# Patient Record
Sex: Male | Born: 1980 | Race: White | Hispanic: No | Marital: Married | Smoking: Never smoker
Health system: Southern US, Community
[De-identification: ages and names within clinical notes are randomized; demographics above are authoritative.]

## PROBLEM LIST (undated history)

## (undated) DIAGNOSIS — N200 Calculus of kidney: Secondary | ICD-10-CM

## (undated) HISTORY — PX: CHOLECYSTECTOMY: SHX55

---

## 2001-07-14 ENCOUNTER — Encounter: Payer: Self-pay | Admitting: Emergency Medicine

## 2001-07-14 ENCOUNTER — Emergency Department (HOSPITAL_COMMUNITY): Admission: EM | Admit: 2001-07-14 | Discharge: 2001-07-14 | Payer: Self-pay | Admitting: Emergency Medicine

## 2001-08-30 ENCOUNTER — Encounter: Admission: RE | Admit: 2001-08-30 | Discharge: 2001-08-30 | Payer: Self-pay | Admitting: Family Medicine

## 2001-08-30 ENCOUNTER — Encounter: Payer: Self-pay | Admitting: Family Medicine

## 2003-02-06 ENCOUNTER — Inpatient Hospital Stay (HOSPITAL_COMMUNITY): Admission: EM | Admit: 2003-02-06 | Discharge: 2003-02-08 | Payer: Self-pay

## 2005-01-31 ENCOUNTER — Emergency Department (HOSPITAL_COMMUNITY): Admission: EM | Admit: 2005-01-31 | Discharge: 2005-02-01 | Payer: Self-pay | Admitting: Emergency Medicine

## 2005-02-26 ENCOUNTER — Emergency Department (HOSPITAL_COMMUNITY): Admission: EM | Admit: 2005-02-26 | Discharge: 2005-02-26 | Payer: Self-pay | Admitting: Emergency Medicine

## 2007-08-19 ENCOUNTER — Emergency Department (HOSPITAL_COMMUNITY): Admission: EM | Admit: 2007-08-19 | Discharge: 2007-08-19 | Payer: Self-pay | Admitting: Emergency Medicine

## 2007-08-31 ENCOUNTER — Emergency Department (HOSPITAL_COMMUNITY): Admission: EM | Admit: 2007-08-31 | Discharge: 2007-09-01 | Payer: Self-pay | Admitting: Emergency Medicine

## 2007-10-02 ENCOUNTER — Encounter (INDEPENDENT_AMBULATORY_CARE_PROVIDER_SITE_OTHER): Payer: Self-pay | Admitting: Surgery

## 2007-10-02 ENCOUNTER — Ambulatory Visit (HOSPITAL_COMMUNITY): Admission: RE | Admit: 2007-10-02 | Discharge: 2007-10-02 | Payer: Self-pay | Admitting: Surgery

## 2009-01-26 ENCOUNTER — Emergency Department (HOSPITAL_BASED_OUTPATIENT_CLINIC_OR_DEPARTMENT_OTHER): Admission: EM | Admit: 2009-01-26 | Discharge: 2009-01-26 | Payer: Self-pay | Admitting: Internal Medicine

## 2011-03-19 ENCOUNTER — Emergency Department (INDEPENDENT_AMBULATORY_CARE_PROVIDER_SITE_OTHER): Payer: BC Managed Care – PPO

## 2011-03-19 ENCOUNTER — Emergency Department (HOSPITAL_BASED_OUTPATIENT_CLINIC_OR_DEPARTMENT_OTHER)
Admission: EM | Admit: 2011-03-19 | Discharge: 2011-03-19 | Disposition: A | Discharge status: Home / Self Care | Payer: BC Managed Care – PPO | Attending: Emergency Medicine | Admitting: Emergency Medicine

## 2011-03-19 DIAGNOSIS — N200 Calculus of kidney: Secondary | ICD-10-CM | POA: Insufficient documentation

## 2011-03-19 DIAGNOSIS — N1330 Unspecified hydronephrosis: Secondary | ICD-10-CM | POA: Insufficient documentation

## 2011-03-19 DIAGNOSIS — N201 Calculus of ureter: Secondary | ICD-10-CM

## 2011-03-19 DIAGNOSIS — N4289 Other specified disorders of prostate: Secondary | ICD-10-CM

## 2011-03-19 DIAGNOSIS — R109 Unspecified abdominal pain: Secondary | ICD-10-CM | POA: Insufficient documentation

## 2011-03-19 LAB — URINALYSIS, ROUTINE W REFLEX MICROSCOPIC: Ketones, ur: NEGATIVE mg/dL

## 2011-03-19 LAB — URINE MICROSCOPIC-ADD ON

## 2011-03-19 LAB — DIFFERENTIAL
Eosinophils Relative: 2 % (ref 0–5)
Lymphocytes Relative: 12 % (ref 12–46)
Lymphs Abs: 1 10*3/uL (ref 0.7–4.0)

## 2011-03-19 LAB — CBC
HCT: 49.7 % (ref 39.0–52.0)
Hemoglobin: 18.1 g/dL — ABNORMAL HIGH (ref 13.0–17.0)
MCH: 30 pg (ref 26.0–34.0)
MCV: 82.3 fL (ref 78.0–100.0)
RBC: 6.04 MIL/uL — ABNORMAL HIGH (ref 4.22–5.81)
WBC: 9.6 10*3/uL (ref 4.0–10.5)

## 2011-03-19 LAB — BASIC METABOLIC PANEL
CO2: 27 mEq/L (ref 19–32)
Calcium: 9.7 mg/dL (ref 8.4–10.5)
Chloride: 103 mEq/L (ref 96–112)
GFR calc Af Amer: >60 mL/min (ref >60)
GFR calc non Af Amer: >60 mL/min (ref >60)

## 2011-03-20 LAB — URINE CULTURE
Colony Count: 9000
Culture  Setup Time: 201204131745

## 2011-03-23 LAB — DIFFERENTIAL
Basophils Absolute: 0.3 10*3/uL — ABNORMAL HIGH (ref 0.0–0.1)
Eosinophils Absolute: 0 10*3/uL (ref 0.0–0.7)
Eosinophils Relative: 0 % (ref 0–5)
Lymphs Abs: 0.6 10*3/uL — ABNORMAL LOW (ref 0.7–4.0)
Monocytes Absolute: 1.4 10*3/uL — ABNORMAL HIGH (ref 0.1–1.0)
Neutrophils Relative %: 86 % — ABNORMAL HIGH (ref 43–77)

## 2011-03-23 LAB — COMPREHENSIVE METABOLIC PANEL
AST: 25 U/L (ref 0–37)
Alkaline Phosphatase: 76 U/L (ref 39–117)
CO2: 28 mEq/L (ref 19–32)
Calcium: 9.6 mg/dL (ref 8.4–10.5)
Chloride: 103 mEq/L (ref 96–112)
Creatinine, Ser: 1.1 mg/dL (ref 0.4–1.5)
GFR calc non Af Amer: >60 mL/min (ref >60)
Potassium: 3.8 mEq/L (ref 3.5–5.1)
Sodium: 144 mEq/L (ref 135–145)

## 2011-03-23 LAB — CBC
HCT: 52.6 % — ABNORMAL HIGH (ref 39.0–52.0)
MCV: 89.4 fL (ref 78.0–100.0)
Platelets: 221 10*3/uL (ref 150–400)
RBC: 5.89 MIL/uL — ABNORMAL HIGH (ref 4.22–5.81)
WBC: 15.9 10*3/uL — ABNORMAL HIGH (ref 4.0–10.5)

## 2011-03-23 LAB — URINALYSIS, ROUTINE W REFLEX MICROSCOPIC
Ketones, ur: NEGATIVE mg/dL
Specific Gravity, Urine: 1.03 (ref 1.005–1.030)

## 2011-04-08 ENCOUNTER — Emergency Department (HOSPITAL_COMMUNITY): Payer: BC Managed Care – PPO

## 2011-04-08 ENCOUNTER — Emergency Department (HOSPITAL_COMMUNITY)
Admission: EM | Admit: 2011-04-08 | Discharge: 2011-04-08 | Disposition: A | Discharge status: Home / Self Care | Payer: BC Managed Care – PPO | Attending: Emergency Medicine | Admitting: Emergency Medicine

## 2011-04-08 DIAGNOSIS — N201 Calculus of ureter: Secondary | ICD-10-CM | POA: Insufficient documentation

## 2011-04-08 DIAGNOSIS — Z9089 Acquired absence of other organs: Secondary | ICD-10-CM | POA: Insufficient documentation

## 2011-04-08 DIAGNOSIS — R109 Unspecified abdominal pain: Secondary | ICD-10-CM | POA: Insufficient documentation

## 2011-04-08 DIAGNOSIS — R918 Other nonspecific abnormal finding of lung field: Secondary | ICD-10-CM | POA: Insufficient documentation

## 2011-04-08 DIAGNOSIS — N390 Urinary tract infection, site not specified: Secondary | ICD-10-CM | POA: Insufficient documentation

## 2011-04-08 LAB — URINALYSIS, ROUTINE W REFLEX MICROSCOPIC
Glucose, UA: NEGATIVE mg/dL
Nitrite: NEGATIVE
Protein, ur: 30 mg/dL — AB
Specific Gravity, Urine: 1.031 — ABNORMAL HIGH (ref 1.005–1.030)
pH: 5.5 (ref 5.0–8.0)

## 2011-04-08 LAB — DIFFERENTIAL
Basophils Relative: 1 % (ref 0–1)
Eosinophils Relative: 2 % (ref 0–5)
Lymphocytes Relative: 14 % (ref 12–46)
Monocytes Relative: 8 % (ref 3–12)
Neutrophils Relative %: 76 % (ref 43–77)

## 2011-04-08 LAB — CBC
MCH: 30.9 pg (ref 26.0–34.0)
MCHC: 36.4 g/dL — ABNORMAL HIGH (ref 30.0–36.0)
MCV: 84.9 fL (ref 78.0–100.0)

## 2011-04-08 LAB — URINE MICROSCOPIC-ADD ON

## 2011-04-09 ENCOUNTER — Ambulatory Visit (HOSPITAL_COMMUNITY)
Admission: RE | Admit: 2011-04-09 | Discharge: 2011-04-09 | Disposition: A | Discharge status: Home / Self Care | Payer: BC Managed Care – PPO | Source: Ambulatory Visit | Attending: Urology | Admitting: Urology

## 2011-04-09 DIAGNOSIS — N201 Calculus of ureter: Secondary | ICD-10-CM | POA: Insufficient documentation

## 2011-04-09 DIAGNOSIS — Z79899 Other long term (current) drug therapy: Secondary | ICD-10-CM | POA: Insufficient documentation

## 2011-04-09 DIAGNOSIS — Z9089 Acquired absence of other organs: Secondary | ICD-10-CM | POA: Insufficient documentation

## 2011-04-09 LAB — BASIC METABOLIC PANEL
BUN: 13 mg/dL (ref 6–23)
CO2: 27 mEq/L (ref 19–32)
Chloride: 103 mEq/L (ref 96–112)
Glucose, Bld: 91 mg/dL (ref 70–99)
Potassium: 3.4 mEq/L — ABNORMAL LOW (ref 3.5–5.1)
Sodium: 139 mEq/L (ref 135–145)

## 2011-04-09 LAB — POCT I-STAT, CHEM 8
BUN: 14 mg/dL (ref 6–23)
Calcium, Ion: 1.19 mmol/L (ref 1.12–1.32)
Creatinine, Ser: 1.4 mg/dL (ref 0.4–1.5)
Hemoglobin: 17.3 g/dL — ABNORMAL HIGH (ref 13.0–17.0)
TCO2: 27 mmol/L (ref 0–100)

## 2011-04-09 LAB — URINE CULTURE: Colony Count: NO GROWTH

## 2011-04-09 LAB — SURGICAL PCR SCREEN: MRSA, PCR: NEGATIVE

## 2011-04-10 NOTE — Op Note (Signed)
Jimmy Serrano, Jimmy Serrano              ACCOUNT NO.:  [id number]  MEDICAL RECORD NO.:  [id number]           PATIENT TYPE:  O  LOCATION:  DAYL                         FACILITY:  [facility]  PHYSICIAN:  Heloise Purpura, MD      DATE OF BIRTH:  July 03, 1981  DATE OF PROCEDURE:  04/09/2011 DATE OF DISCHARGE:                              OPERATIVE REPORT   PREOPERATIVE DIAGNOSIS:  Right ureteral calculus.  POSTOPERATIVE DIAGNOSIS:  Right ureteral calculus.  PROCEDURES: 1. Cystoscopy. 2. Right retrograde pyelography with interpretation. 3. Right ureteroscopic laser lithotripsy and stone removal. 4. Right ureteral stent placement (6 x 24).  SURGEON:  Heloise Purpura, MD  ANESTHESIA:  General.  COMPLICATIONS:  None.  INDICATIONS:  Jimmy Serrano is a 30 year old gentleman who had recently presented with a 4-mm right mid ureteral calculus to Dr. Vonita Moss.  He had episodes of fever and apparently had been placed on antibiotics a few days after his initial diagnosis.  He then presented today to me for the first time with complaints of worsening flank pain and general malaise, although no objective fever.  He is currently on ciprofloxacin. After discussing options, I recommended that he proceed to the operating room for ureteroscopic intervention and if he was found to be infected intraoperatively, I recommended right ureteral stent placement with definitive therapy reserved following appropriate antibiotic therapy. His urinalysis was negative for infection on evaluation earlier today. The potential risks, complications, and alternative treatment options were discussed in detail and informed consent obtained.  DESCRIPTION OF PROCEDURE:  The patient was taken to the operating room and a general anesthetic was administered.  He was given preoperative antibiotics, placed in the dorsal lithotomy position, and prepped and draped in the usual sterile fashion.  Next, a preoperative time-out was performed.   Cystourethroscopy was then performed, which revealed a normal urethra and no evidence of any bladder tumors, stones, or other mucosal abnormalities within the bladder on systematic examination.  The right ureteral orifice was then identified and intubated with a 6-French ureteral catheter.  Omnipaque contrast was injected and there was noted to be a filling defect within the distal right ureter consistent with the patient's known stone.  A 0.038 sensor guidewire was then advanced up into the right renal pelvis under fluoroscopic guidance and a 6- French semi-rigid ureteroscope was advanced next to the wire.  There was noted to be a narrowed area within the distal ureter, although this was able to be traversed with the ureteroscope.  The stone was identified immediately beyond this narrowed area and a 365 micron holmium laser fiber was then used to fragment the stone on a setting of 0.6 joules and 6 Hz.  Once adequate fragmentation was performed, all stone fragments were removed with a ZeroTip nitinol basket.  Repeat ureteroscopy revealed no remaining stone fragments.  No purulent material or other evidence for acute infection was identified.  Although ureteroscopy was atraumatic, based on the patient's history of fever approximately a week ago, it was felt that an ureteral stent was indicated.  Therefore, the wire was back loaded on the cystoscope and a 6 x 24 double-J ureteral stent was  advanced over the wire using Seldinger technique.  It was appropriately positioned under fluoroscopic and cystoscopic guidance. The wire was then removed with a good curl noted in the renal pelvis as well as in the bladder.  The patient's bladder was emptied.  He tolerated the procedure well without complications.  He was able to be awakened and transferred to recovery unit in satisfactory condition.     Heloise Purpura, MD     LB/MEDQ  D:  04/09/2011  T:  04/10/2011  Job:  [id number]  Electronically  Signed by Heloise Purpura MD on 04/10/2011 04:22:31 PM

## 2011-04-20 NOTE — Op Note (Signed)
NAMEMATVEY, LLANAS              ACCOUNT NO.:  [id number]   MEDICAL RECORD NO.:  [id number]          PATIENT TYPE:  AMB   LOCATION:  DAY                          FACILITY:  [facility]   PHYSICIAN:  Currie Paris, M.D.DATE OF BIRTH:  [birth year]   DATE OF PROCEDURE:  10/02/2007  DATE OF DISCHARGE:                               OPERATIVE REPORT   [phone].   PREOPERATIVE DIAGNOSIS:  Gallstones and chronic cholecystitis.   POSTOPERATIVE DIAGNOSIS:  Gallstones and chronic cholecystitis.   OPERATION:  Laparoscopic cholecystectomy with operative cholangiogram.   SURGEON:  Currie Paris, M.D.   ASSISTANT:  Lennie Muckle, MD   ANESTHESIA:  General endotracheal.   CLINICAL HISTORY:  This is a 30 year old male recently presented to  emergency room with acute episode of biliary colic and was found to have  gallstones.  He was scheduled for elective cholecystectomy.   DESCRIPTION OF PROCEDURE:  The patient was seen in the holding area and  he had no further questions.  We confirmed that cholecystectomy was the  planned procedure.   The patient was taken to the operating room and after satisfactory  general endotracheal anesthesia had been obtained the abdomen was  clipped, prepped and draped.  The time-out was performed.   I used 0.25% plain Marcaine for each incision.  The umbilical incision  was made, the fascia identified and the peritoneal cavity entered under  direct vision.  A pursestring was placed, Hasson introduced and the  abdomen insufflated to 15.  The camera was placed and we saw that the  gallbladder was a little bit tense but not acutely inflamed.  No other  abnormalities were noted.   Under direct vision a 10-11 was placed in the epigastrium and two 5 mm  laterally.  The gallbladder was retracted over the liver.  Dissection  began the triangle of Calot, identified a nice long segment of cystic  duct as well as the cystic artery.  I clipped the cystic artery  one-time  and the cystic duct once at its junction with the gallbladder.  Prior to  doing so I opened up the peritoneum in the triangle of Calot and had a  nice window to make sure I had the anatomy correctly identified.   The [facility] catheter was used introducing into the cystic duct and held  with two clips and cholangiography done which showed a fairly long  cystic duct filling of the common duct and duodenum and filling of  hepatic radicals.   The catheter was removed and three clips placed on the stay side of  cystic duct and it was divided.  The peritoneum of the gallbladder was  freed up a little bit more and additional clips placed on the anterior  and posterior branches of cystic artery and they were divided and the  gallbladder removed from below to above.  It was placed in a bag and we  spent several minutes irrigating making sure the bed of gallbladder was  completely dry and saw no evidence of either bleeding or bile leaks.   The gallbladder was brought out the umbilical  port.  The abdomen was  reinsufflated and a final check again made to be sure there is no  bleeding or bile leak and all appeared okay.   The irrigant fluid was suctioned out.  The lateral drains removed under  direct vision.  The pursestring was used to close the umbilical port.  The abdomen was deflated through the epigastric port.  Skin was closed  with 4-0 Monocryl subcuticular plus Dermabond.   The patient tolerated the procedure well and there no operative  complications.  All counts were correct.      Currie Paris, M.D.  Electronically Signed     CJS/MEDQ  D:  10/02/2007  T:  10/03/2007  Job:  [id number]

## 2011-04-23 NOTE — H&P (Signed)
NAME:  Jimmy Serrano, Jimmy Serrano                        ACCOUNT NO.:  [id number]   MEDICAL RECORD NO.:  [id number]                   PATIENT TYPE:  INP   LOCATION:  1827                                 FACILITY:  [facility]   PHYSICIAN:  Juan-Carlos Monguilod, M.D.         DATE OF BIRTH:  [birth year]   DATE OF ADMISSION:  02/06/2003  DATE OF DISCHARGE:                                HISTORY & PHYSICAL   CHIEF COMPLAINT:  Intractable nausea, vomiting, diarrhea.   PROBLEM LIST:  1. Intractable nausea and vomiting, diarrhea x 24 hours with epigastric     pain, fever with temperature of 101, white blood cells elevated at 16.0,     mild elevation of total bilirubin with normal amylase and lipase.  2. Dehydration with acute renal failure with BUN currently 17, creatinine     1.3, unclear normal baseline.  3. History of right kidney stone, 1 to 2 mm.  CT scan of the abdomen and     pelvis done July 17, 2001 notes 1 to 2 mm distal right ureteral     calculus.  There was mild distention of the right renal collecting     system.  4. Left knee pain 08/31/2001 status post MRI of the left knee, notes bone     bruise medial femoral condyle, sprain/partial tear proximal to mid aspect     lateral collateral ligament, attenuated anterior cruciate ligament     suggestive of partial tear of questionable age, mild irregularity free     edge of bone both medial and lateral meniscus without evidence of     displaced meniscal fragment and small joint effusion present.   ALLERGIES:  No known drug allergies.   HISTORY OF PRESENT ILLNESS:  The patient began feeling nauseous in the  afternoon of 02/05/2003.  This progressed, and overnight he had multiple  episodes of vomiting and diarrhea.  Unable to keep food or fluids down.  There was some epigastric pain described as mild/moderate.  This did not  improve by a.m., and the patient came to [facility] for evaluation  and treatment.   FAMILY HISTORY:  Mother  and father living.  No reports of familial diseases,  and review was noncontributory.   SOCIAL HISTORY:  The patient is married and is followed in primary care by  Dr. Arlyce Dice by  [facility].  Has had no significant medical  history other than above.  Takes no routine medications.  He is a nonsmoker  and nondrinker.  Does not use illicit drugs.   REVIEW OF SYSTEMS:  HEENT:  No visual changes, no headache, no nose bleeds  or sinus drainage.  No swallowing difficulties.  HEART:  No chest pain or  palpitations.  LUNGS: No shortness of breath or cough.  ABDOMEN: As under  History of Present Illness.  The patient also has a distant history of right  abdominal pain with ER visit noted per CT  scan for right renal calculus.  URINARY/GENITAL:  Denies any dysuria, hematuria, or any symptoms suggestive  of urinary tract infection.  MUSCULOSKELETAL: No traumas or joint pain.  NEUROLOGIC:  No focal changes.  INTEGUMENTARY:  No abnormal ulcers or  lesions.   MEDICATIONS ON ADMISSION:  No routine medications or prescription  medications used.   PHYSICAL EXAMINATION:  VITAL SIGNS:  Temperature 101.9, pulse 114,  respirations 20, blood pressure 112/68.  GENERAL:  Well-developed young male in no acute distress.  Alert, oriented,  cooperative.  HEENT:  Normocephalic.  Eyes: PERRLA.  Ears: Canals clear.  Nose: Nares  clear without masses or discharge noted.  Oral mucosa pink though somewhat  dry.  NECK:  No jugular venous distention or bruits.  HEART:  Rate and rhythm regular without murmur, S3, S4.  LUNGS:  Breath sounds clear and equal bilaterally.  ABDOMEN:  Soft and round with positive though very hypotonic bowel sounds in  all four quadrants.  Mild diffuse pain seems greatest over the epigastric  area.  No rebound or guarding.  URINARY/GENITAL:  No bladder pain or CVA tenderness.  RECTAL:  Deferred.  MUSCULOSKELETAL:  Range of motion and strength 5/5 and equal.  NEUROLOGIC:   Cranial nerves II-XII grossly intact without unilateral or  focal defects.  INTEGUMENTARY:  Skin warm and dry.   LABORATORY DATA:  CT scan of abdomen and pelvis noted mild sigmoid tic,  otherwise negative.   CBC found WBC 16.0, hemoglobin 18.0, hematocrit 51.1, platelets 195,  neutrophil elevated 92, lymphocyte low at 2.  Metabolic panel: Sodium 131,  potassium 3.7, chloride 105, CO2 27, BUN 17, and creatinine 1.3.  Glucose  elevated at 168.  Liver function tests unremarkable, though total bilirubin  mildly elevated at 1.5.  Lipase 23 and amylase 59.  Urinalysis noted  elevated specific gravity greater than 1.040, otherwise unremarkable.   IMPRESSION AND PLAN:  1. Intractable nausea and vomiting likely secondary to viral     gastroenteritis.  Though viral gastroenteritis is the most likely     etiology here, will have to keep in mind possibilities of irritable bowel     syndrome or ulcerative colitis.  Supportive care at this point with IV     fluids, clear liquids as tolerated, and advance after 24 hours back to     previous diet.  No indication for antibiotic use.  Urine appears benign     other than indicating dehydration.  Would recheck a CBC in a.m.  2. Dehydration with acute renal failure.  Will need intravenous hydration     with normal saline at 200 ml per hour.  This until oral diet and fluid     intake reestablished.  Would recheck a basic metabolic panel in the a.m.  3. Intractable nausea and vomiting secondary to problem #1.  Provide     antiemetic IV as needed.  Clear liquid diet and advance in 24 hours if     tolerated back to previous.  4. Intractable diarrhea secondary to #1.  Imodium p.r.n.  If this persists,     would evaluate stool ovum and parasite, culture.  5. Mild hyperglycemia thought likely secondary to infection.  Will check a     hemoglobin A1C in a.m.  6. Allergies:  No known drug allergies.    Everett Graff, N.P.                     Rosanne Sack, M.D.    TC/MEDQ  D:  02/06/2003  T:  02/06/2003  Job:  [id number]

## 2011-09-15 LAB — COMPREHENSIVE METABOLIC PANEL WITH GFR
ALT: 42
AST: 29
Albumin: 4.6
Alkaline Phosphatase: 62
BUN: 12
CO2: 28
Calcium: 9.4
Chloride: 104
Creatinine, Ser: 1.05
GFR calc non Af Amer: >60
Glucose, Bld: 93
Potassium: 3.8
Sodium: 141
Total Bilirubin: 1.6 — ABNORMAL HIGH
Total Protein: 7.4

## 2011-09-15 LAB — URINALYSIS, ROUTINE W REFLEX MICROSCOPIC
Hgb urine dipstick: NEGATIVE
Nitrite: NEGATIVE
Protein, ur: NEGATIVE
Specific Gravity, Urine: 1.024
Urobilinogen, UA: 1

## 2011-09-15 LAB — CBC
MCV: 88
Platelets: 203
RBC: 5.56
WBC: 7.3

## 2011-09-15 LAB — DIFFERENTIAL
Basophils Absolute: 0.1
Eosinophils Absolute: 0.2
Eosinophils Relative: 3
Lymphocytes Relative: 19
Monocytes Absolute: 0.7

## 2011-09-15 LAB — LIPASE, BLOOD: Lipase: 27

## 2011-09-16 LAB — CBC
HCT: 47
Hemoglobin: 16.4
MCHC: 35
Platelets: 211
RDW: 12.7

## 2011-09-16 LAB — I-STAT 8, (EC8 V) (CONVERTED LAB)
Acid-Base Excess: 1
Chloride: 105
HCT: 49
Hemoglobin: 16.7
Operator id: 270651
Potassium: 3.6
Sodium: 139
TCO2: 27

## 2011-09-16 LAB — POCT I-STAT CREATININE
Creatinine, Ser: 1.2
Operator id: 270651

## 2011-09-16 LAB — COMPREHENSIVE METABOLIC PANEL
Albumin: 4.5
BUN: 14
Calcium: 9.2
Glucose, Bld: 128 — ABNORMAL HIGH
Total Protein: 7.5

## 2011-09-16 LAB — DIFFERENTIAL
Basophils Absolute: 0
Basophils Relative: 0
Eosinophils Relative: 0
Monocytes Absolute: 1 — ABNORMAL HIGH
Neutro Abs: 10.7 — ABNORMAL HIGH

## 2011-09-16 LAB — LIPASE, BLOOD: Lipase: 27

## 2011-09-17 LAB — SAMPLE TO BLOOD BANK

## 2011-09-17 LAB — I-STAT 8, (EC8 V) (CONVERTED LAB)
Acid-Base Excess: 1
BUN: 14
Chloride: 104
pCO2, Ven: 45.5
pH, Ven: 7.385 — ABNORMAL HIGH

## 2011-09-17 LAB — DIFFERENTIAL
Basophils Absolute: 0.1
Eosinophils Relative: 4
Lymphocytes Relative: 21
Neutrophils Relative %: 64

## 2011-09-17 LAB — POCT I-STAT CREATININE: Creatinine, Ser: 1.2

## 2011-09-17 LAB — CBC
HCT: 48.3
Platelets: 246
RDW: 13.1

## 2017-01-18 ENCOUNTER — Encounter (HOSPITAL_BASED_OUTPATIENT_CLINIC_OR_DEPARTMENT_OTHER): Payer: Self-pay | Admitting: *Deleted

## 2017-01-18 ENCOUNTER — Emergency Department (HOSPITAL_BASED_OUTPATIENT_CLINIC_OR_DEPARTMENT_OTHER)
Admission: EM | Admit: 2017-01-18 | Discharge: 2017-01-18 | Disposition: A | Discharge status: Home / Self Care | Payer: BLUE CROSS/BLUE SHIELD | Attending: Emergency Medicine | Admitting: Emergency Medicine

## 2017-01-18 ENCOUNTER — Emergency Department (HOSPITAL_BASED_OUTPATIENT_CLINIC_OR_DEPARTMENT_OTHER): Payer: BLUE CROSS/BLUE SHIELD

## 2017-01-18 DIAGNOSIS — R5383 Other fatigue: Secondary | ICD-10-CM | POA: Insufficient documentation

## 2017-01-18 DIAGNOSIS — F17200 Nicotine dependence, unspecified, uncomplicated: Secondary | ICD-10-CM | POA: Insufficient documentation

## 2017-01-18 DIAGNOSIS — R0789 Other chest pain: Secondary | ICD-10-CM | POA: Insufficient documentation

## 2017-01-18 DIAGNOSIS — R630 Anorexia: Secondary | ICD-10-CM | POA: Diagnosis not present

## 2017-01-18 DIAGNOSIS — R0602 Shortness of breath: Secondary | ICD-10-CM | POA: Diagnosis not present

## 2017-01-18 DIAGNOSIS — R531 Weakness: Secondary | ICD-10-CM | POA: Insufficient documentation

## 2017-01-18 HISTORY — DX: Calculus of kidney: N20.0

## 2017-01-18 LAB — BASIC METABOLIC PANEL
ANION GAP: 4 — AB (ref 5–15)
BUN: 15 mg/dL (ref 6–20)
CALCIUM: 8.9 mg/dL (ref 8.9–10.3)
CO2: 26 mmol/L (ref 22–32)
CREATININE: 1.16 mg/dL (ref 0.61–1.24)
Chloride: 106 mmol/L (ref 101–111)
GFR calc Af Amer: >60 mL/min (ref >60)
Glucose, Bld: 96 mg/dL (ref 65–99)
Potassium: 3.7 mmol/L (ref 3.5–5.1)
Sodium: 136 mmol/L (ref 135–145)

## 2017-01-18 LAB — TROPONIN I: Troponin I: <0.03 ng/mL (ref <0.03)

## 2017-01-18 LAB — CBC WITH DIFFERENTIAL/PLATELET
Basophils Absolute: 0.1 10*3/uL (ref 0.0–0.1)
Basophils Relative: 1 %
EOS ABS: 0.4 10*3/uL (ref 0.0–0.7)
EOS PCT: 4 %
HCT: 44.8 % (ref 39.0–52.0)
Hemoglobin: 16.3 g/dL (ref 13.0–17.0)
LYMPHS ABS: 2.6 10*3/uL (ref 0.7–4.0)
Lymphocytes Relative: 24 %
MCH: 31 pg (ref 26.0–34.0)
MCHC: 36.4 g/dL — AB (ref 30.0–36.0)
MCV: 85.2 fL (ref 78.0–100.0)
MONOS PCT: 10 %
Monocytes Absolute: 1 10*3/uL (ref 0.1–1.0)
NEUTROS PCT: 63 %
Neutro Abs: 6.7 10*3/uL (ref 1.7–7.7)
PLATELETS: 212 10*3/uL (ref 150–400)
RBC: 5.26 MIL/uL (ref 4.22–5.81)
RDW: 12.7 % (ref 11.5–15.5)
WBC: 10.8 10*3/uL — AB (ref 4.0–10.5)

## 2017-01-18 LAB — BRAIN NATRIURETIC PEPTIDE: B NATRIURETIC PEPTIDE 5: 7.6 pg/mL (ref 0.0–100.0)

## 2017-01-18 MED ORDER — DOXYCYCLINE HYCLATE 100 MG PO CAPS: 100.0000 mg | ORAL_CAPSULE | Freq: Two times a day (BID) | ORAL | 0 refills | Status: AC

## 2017-01-18 MED ORDER — ASPIRIN 81 MG PO CHEW: 324.0000 mg | CHEWABLE_TABLET | Freq: Once | ORAL | Status: DC

## 2017-01-18 NOTE — ED Triage Notes (Signed)
Weak and fatigue over the past 3 days. Symptoms have progressed to SOB with ambulating. He was seen at [facility] tonight and told he does not have the flu.

## 2017-01-18 NOTE — ED Provider Notes (Signed)
Emergency Department Provider Note  By signing my name below, I, Majel Homereyton Lee, attest that this documentation has been prepared under the direction and in the presence of Maia PlanJoshua G Bettymae Yott, MD . Electronically Signed: Majel HomerPeyton Lee, Scribe. 01/18/2017. 6:52 PM.  I have reviewed the triage vital signs and the nursing notes.  HISTORY  Chief Complaint Shortness of Breath  HPI Comments: Jimmy Serrano is a 36 y.o. male who presents to the Emergency Department complaining of persistent, weakness and fatigue that began ~3 days ago. Pt reports associated left sided, constant, chest "pressure" that began 2 days ago, shortness of breath that is exacerbated with exertion, and decreased appetite. He notes he awoke several times last night with left arm pain described as "it falling asleep" and experienced similar pain in his left hand today. He also states a "sharp" pain that begins at the base of his skull and radiates into his posterior neck that began this morning. Pt reports he visited an Urgent Care facility this afternoon in which he was given four aspirin and referred to the ED for further evaluation. He states he has also taken Advil at home for his pain without any relief. He denies any obvious bleeding, melena, leg swelling, hx of HTN, HLD, DM, and FHx of MI.   Past Medical History:  Diagnosis Date  . Kidney stones    There are no active problems to display for this patient.  Past Surgical History:  Procedure Laterality Date  . CHOLECYSTECTOMY     Allergies Patient has no known allergies.  No family history on file.  Social History Social History  Substance Use Topics  . Smoking status: Never Smoker  . Smokeless tobacco: Current User  . Alcohol use No    Review of Systems   10-point ROS otherwise negative.  ____________________________________________   PHYSICAL EXAM:  VITAL SIGNS: ED Triage Vitals  Enc Vitals Group     BP 01/18/17 1811 141/95     Pulse Rate 01/18/17  1811 76     Resp 01/18/17 1811 20     Temp 01/18/17 1811 98.3 F (36.8 C)     Temp Source 01/18/17 1811 Oral     SpO2 01/18/17 1811 98 %     Weight 01/18/17 1809 214 lb (97.1 kg)     Height 01/18/17 1809 5\' 11"  (1.803 m)     Pain Score 01/18/17 1809 5   Constitutional: Alert and oriented. Well appearing and in no acute distress. Eyes: Conjunctivae are normal.  Head: Atraumatic. Nose: No congestion/rhinnorhea. Mouth/Throat: Mucous membranes are moist.  Oropharynx non-erythematous. Neck: No stridor.   Cardiovascular: Normal rate, regular rhythm. Good peripheral circulation. Grossly normal heart sounds.   Respiratory: Normal respiratory effort.  No retractions. Lungs CTAB. Gastrointestinal: Soft and nontender. No distention.  Musculoskeletal: No lower extremity tenderness nor edema. No gross deformities of extremities. Neurologic:  Normal speech and language. No gross focal neurologic deficits are appreciated.  Skin:  Skin is warm, dry and intact. No rash noted. Psychiatric: Mood and affect are normal. Speech and behavior are normal.  ____________________________________________   LABS (all labs ordered are listed, but only abnormal results are displayed)  Labs Reviewed  BASIC METABOLIC PANEL - Abnormal; Notable for the following:       Result Value   Anion gap 4 (*)    All other components within normal limits  CBC WITH DIFFERENTIAL/PLATELET - Abnormal; Notable for the following:    WBC 10.8 (*)    MCHC 36.4 (*)  All other components within normal limits  TROPONIN I  BRAIN NATRIURETIC PEPTIDE   ____________________________________________  EKG   EKG Interpretation  Date/Time:  Tuesday January 18 2017 18:17:23 EST Ventricular Rate:  78 PR Interval:  146 QRS Duration: 94 QT Interval:  362 QTC Calculation: 412 R Axis:   46 Text Interpretation:  Normal sinus rhythm Normal ECG No STEMI.  Confirmed by Khalil Belote MD, Kawthar Ennen [id number]) on 01/18/2017 6:46:00 PM        ____________________________________________  RADIOLOGY  Dg Chest 2 View  Result Date: 01/18/2017 CLINICAL DATA:  Chest pain, shob, weakness, left arm pain x 2 days. Only has history of kidney stones and cholecystectomy. EXAM: CHEST  2 VIEW COMPARISON:  02/26/2005 FINDINGS: Low lung volumes are present, causing crowding of the pulmonary vasculature. Bandlike retrocardiac density believed to be in the left lower lobe. No pleural effusion. The lungs appear otherwise clear. Cardiac and mediastinal margins appear normal. IMPRESSION: 1. Bandlike left lower lobe density favors atelectasis over early pneumonia. 2. Low lung volumes are present, causing crowding of the pulmonary vasculature. Electronically Signed   By: Gaylyn Rong M.D.   On: 01/18/2017 19:25    ____________________________________________   PROCEDURES  Procedure(s) performed:   Procedures  None ____________________________________________   INITIAL IMPRESSION / ASSESSMENT AND PLAN / ED COURSE  Pertinent labs & imaging results that were available during my care of the patient were reviewed by me and considered in my medical decision making (see chart for details).  Patient presents to the ED for evaluation of constant chest pressure for the last several days. He also notes SOB with ambulation. No evidence of volume overload on exam. CXR unremarkable. Normal EKG and troponin with low HEART score. With 3 days of constant symptoms will not trend biomarkers. Plan for PCP and Cardiology follow up in the coming days to schedule outpatient ECHO and Stress Testing. Wife and patient verbalized understanding and are pleased at discharge.   At this time, I do not feel there is any life-threatening condition present. I have reviewed and discussed all results (EKG, imaging, lab, urine as appropriate), exam findings with patient. I have reviewed nursing notes and appropriate previous records.  I feel the patient is safe to be  discharged home without further emergent workup. Discussed usual and customary return precautions. Patient and family (if present) verbalize understanding and are comfortable with this plan.  Patient will follow-up with their primary care provider. If they do not have a primary care provider, information for follow-up has been provided to them. All questions have been answered.   I personally performed the services described in this documentation, which was scribed in my presence. The recorded information has been reviewed and is accurate.    ____________________________________________  FINAL CLINICAL IMPRESSION(S) / ED DIAGNOSES  Final diagnoses:  SOB (shortness of breath)     MEDICATIONS GIVEN DURING THIS VISIT:  Medications - No data to display   NEW OUTPATIENT MEDICATIONS STARTED DURING THIS VISIT:  Discharge Medication List as of 01/18/2017  7:53 PM    START taking these medications   Details  doxycycline (VIBRAMYCIN) 100 MG capsule Take 1 capsule (100 mg total) by mouth 2 (two) times daily., Starting Tue 01/18/2017, Until Tue 01/25/2017, Print       Note:  This document was prepared using Dragon voice recognition software and may include unintentional dictation errors.  Alona Bene, MD Emergency Medicine   Maia Plan, MD 01/19/17 1425

## 2017-01-18 NOTE — Discharge Instructions (Signed)
We believe that your symptoms are caused today by pneumonia, an infection in your lung(s).  Fortunately you should start to improve quickly after taking your antibiotics.  Please take the full course of antibiotics as prescribed and drink plenty of fluids.   ° °Follow up with your doctor within 1-2 days.  If you develop any new or worsening symptoms, including but not limited to fever in spite of taking over-the-counter ibuprofen and/or Tylenol, persistent vomiting, worsening shortness of breath, or other symptoms that concern you, please return to the Emergency Department immediately.  ° ° °Pneumonia °Pneumonia is an infection of the lungs.  °CAUSES °Pneumonia may be caused by bacteria or a virus. Usually, these infections are caused by breathing infectious particles into the lungs (respiratory tract). °SIGNS AND SYMPTOMS  °Cough. °Fever. °Chest pain. °Increased rate of breathing. °Wheezing. °Mucus production. °DIAGNOSIS  °If you have the common symptoms of pneumonia, your health care provider will typically confirm the diagnosis with a chest X-ray. The X-ray will show an abnormality in the lung (pulmonary infiltrate) if you have pneumonia. Other tests of your blood, urine, or sputum may be done to find the specific cause of your pneumonia. Your health care provider may also do tests (blood gases or pulse oximetry) to see how well your lungs are working. °TREATMENT  °Some forms of pneumonia may be spread to other people when you cough or sneeze. You may be asked to wear a mask before and during your exam. Pneumonia that is caused by bacteria is treated with antibiotic medicine. Pneumonia that is caused by the influenza virus may be treated with an antiviral medicine. Most other viral infections must run their course. These infections will not respond to antibiotics.  °HOME CARE INSTRUCTIONS  °Cough suppressants may be used if you are losing too much rest. However, coughing protects you by clearing your lungs. You  should avoid using cough suppressants if you can. °Your health care provider may have prescribed medicine if he or she thinks your pneumonia is caused by bacteria or influenza. Finish your medicine even if you start to feel better. °Your health care provider may also prescribe an expectorant. This loosens the mucus to be coughed up. °Take medicines only as directed by your health care provider. °Do not smoke. Smoking is a common cause of bronchitis and can contribute to pneumonia. If you are a smoker and continue to smoke, your cough may last several weeks after your pneumonia has cleared. °A cold steam vaporizer or humidifier in your room or home may help loosen mucus. °Coughing is often worse at night. Sleeping in a semi-upright position in a recliner or using a couple pillows under your head will help with this. °Get rest as you feel it is needed. Your body will usually let you know when you need to rest. °PREVENTION °A pneumococcal shot (vaccine) is available to prevent a common bacterial cause of pneumonia. This is usually suggested for: °People over 65 years old. °Patients on chemotherapy. °People with chronic lung problems, such as bronchitis or emphysema. °People with immune system problems. °If you are over 65 or have a high risk condition, you may receive the pneumococcal vaccine if you have not received it before. In some countries, a routine influenza vaccine is also recommended. This vaccine can help prevent some cases of pneumonia. You may be offered the influenza vaccine as part of your care. °If you smoke, it is time to quit. You may receive instructions on how to stop smoking. Your   health care provider can provide medicines and counseling to help you quit. °SEEK MEDICAL CARE IF: °You have a fever. °SEEK IMMEDIATE MEDICAL CARE IF:  °Your illness becomes worse. This is especially true if you are elderly or weakened from any other disease. °You cannot control your cough with suppressants and are losing  sleep. °You begin coughing up blood. °You develop pain which is getting worse or is uncontrolled with medicines. °Any of the symptoms which initially brought you in for treatment are getting worse rather than better. °You develop shortness of breath or chest pain. °MAKE SURE YOU:  °Understand these instructions. °Will watch your condition. °Will get help right away if you are not doing well or get worse. °Document Released: 11/22/2005 Document Revised: 04/08/2014 Document Reviewed: 02/11/2011 °ExitCare® Patient Information ©2015 [employer]. This information is not intended to replace advice given to you by your health care provider. Make sure you discuss any questions you have with your health care provider. ° ° ° °

## 2018-02-15 ENCOUNTER — Other Ambulatory Visit: Payer: Self-pay

## 2018-02-15 ENCOUNTER — Encounter (HOSPITAL_COMMUNITY): Payer: Self-pay | Admitting: Emergency Medicine

## 2018-02-15 ENCOUNTER — Emergency Department (HOSPITAL_COMMUNITY): Payer: Commercial Managed Care - PPO

## 2018-02-15 ENCOUNTER — Emergency Department (HOSPITAL_COMMUNITY)
Admission: EM | Admit: 2018-02-15 | Discharge: 2018-02-15 | Disposition: A | Discharge status: Home / Self Care | Payer: Commercial Managed Care - PPO | Attending: Emergency Medicine | Admitting: Emergency Medicine

## 2018-02-15 DIAGNOSIS — R0789 Other chest pain: Secondary | ICD-10-CM

## 2018-02-15 LAB — DIFFERENTIAL
BASOS ABS: 0.1 10*3/uL (ref 0.0–0.1)
BASOS PCT: 1 %
Eosinophils Absolute: 0.2 10*3/uL (ref 0.0–0.7)
Eosinophils Relative: 2 %
Lymphocytes Relative: 23 %
Lymphs Abs: 1.8 10*3/uL (ref 0.7–4.0)
MONOS PCT: 10 %
Monocytes Absolute: 0.8 10*3/uL (ref 0.1–1.0)
NEUTROS ABS: 5.1 10*3/uL (ref 1.7–7.7)
NEUTROS PCT: 64 %

## 2018-02-15 LAB — HEPATIC FUNCTION PANEL
ALBUMIN: 4.4 g/dL (ref 3.5–5.0)
ALT: 31 U/L (ref 17–63)
AST: 25 U/L (ref 15–41)
Alkaline Phosphatase: 65 U/L (ref 38–126)
BILIRUBIN DIRECT: 0.1 mg/dL (ref 0.1–0.5)
Indirect Bilirubin: 1 mg/dL — ABNORMAL HIGH (ref 0.3–0.9)
Total Bilirubin: 1.1 mg/dL (ref 0.3–1.2)
Total Protein: 7.7 g/dL (ref 6.5–8.1)

## 2018-02-15 LAB — CBC
HCT: 46.5 % (ref 39.0–52.0)
HEMOGLOBIN: 15.9 g/dL (ref 13.0–17.0)
MCH: 30.5 pg (ref 26.0–34.0)
MCHC: 34.2 g/dL (ref 30.0–36.0)
MCV: 89.1 fL (ref 78.0–100.0)
Platelets: 199 10*3/uL (ref 150–400)
RBC: 5.22 MIL/uL (ref 4.22–5.81)
RDW: 12.6 % (ref 11.5–15.5)
WBC: 8.1 10*3/uL (ref 4.0–10.5)

## 2018-02-15 LAB — I-STAT TROPONIN, ED
TROPONIN I, POC: 0 ng/mL (ref 0.00–0.08)
TROPONIN I, POC: 0 ng/mL (ref 0.00–0.08)

## 2018-02-15 LAB — BASIC METABOLIC PANEL
ANION GAP: 8 (ref 5–15)
BUN: 12 mg/dL (ref 6–20)
CO2: 28 mmol/L (ref 22–32)
Calcium: 9.4 mg/dL (ref 8.9–10.3)
Chloride: 104 mmol/L (ref 101–111)
Creatinine, Ser: 1.08 mg/dL (ref 0.61–1.24)
Glucose, Bld: 96 mg/dL (ref 65–99)
Potassium: 4.3 mmol/L (ref 3.5–5.1)
Sodium: 140 mmol/L (ref 135–145)

## 2018-02-15 LAB — D-DIMER, QUANTITATIVE (NOT AT ARMC)

## 2018-02-15 MED ORDER — NITROGLYCERIN 0.4 MG SL SUBL
0.4000 mg | SUBLINGUAL_TABLET | SUBLINGUAL | Status: DC | PRN
  Administered 2018-02-15: 0.4 mg via SUBLINGUAL
  Filled 2018-02-15: qty 1

## 2018-02-15 MED ORDER — MORPHINE SULFATE (PF) 2 MG/ML IV SOLN
2.0000 mg | Freq: Once | INTRAVENOUS | Status: AC
  Administered 2018-02-15: 2 mg via INTRAVENOUS
  Filled 2018-02-15: qty 1

## 2018-02-15 NOTE — ED Triage Notes (Signed)
Pt reports CP X1 hour with radiation to neck and L arm. Denies SOB. Also states high BP readings with dizziness and blurred vision. Denies Hx of HTN.

## 2018-02-15 NOTE — ED Notes (Signed)
Received report from [employer] RN. Pt resting, on cel phone. Nad.

## 2018-02-17 NOTE — ED Provider Notes (Signed)
[facility]GENCY [facility]PARTMENT Provider Note   CSN: [id number] Arrival date & time: 02/15/18  1059     History   Chief Complaint Chief Complaint  Patient presents with  . Chest Pain    HPI Lynelle SmokeDerek L Vanhise is a 37 y.o. male.  Patient complains of chest pain off and on.   The history is provided by the patient.  Chest Pain   This is a new problem. The current episode started 12 to 24 hours ago. The problem occurs rarely. The problem has been resolved. The pain is associated with exertion. The pain is present in the substernal region. Pertinent negatives include no abdominal pain, no back pain, no cough and no headaches.  Pertinent negatives for past medical history include no seizures.    Past Medical History:  Diagnosis Date  . Kidney stones     There are no active problems to display for this patient.   Past Surgical History:  Procedure Laterality Date  . CHOLECYSTECTOMY         Home Medications    Prior to Admission medications   Not on File    Family History History reviewed. No pertinent family history.  Social History Social History   Tobacco Use  . Smoking status: Never Smoker  . Smokeless tobacco: Never Used  Substance Use Topics  . Alcohol use: No  . Drug use: No     Allergies   Patient has no known allergies.   Review of Systems Review of Systems  Constitutional: Negative for appetite change and fatigue.  HENT: Negative for congestion, ear discharge and sinus pressure.   Eyes: Negative for discharge.  Respiratory: Negative for cough.   Cardiovascular: Positive for chest pain.  Gastrointestinal: Negative for abdominal pain and diarrhea.  Genitourinary: Negative for frequency and hematuria.  Musculoskeletal: Negative for back pain.  Skin: Negative for rash.  Neurological: Negative for seizures and headaches.  Psychiatric/Behavioral: Negative for hallucinations.     Physical Exam Updated Vital Signs BP 118/87   Pulse 70    Temp 98.2 F (36.8 C) (Oral)   Resp 18   Ht 5\' 11"  (1.803 m)   Wt 95.3 kg (210 lb)   SpO2 96%   BMI 29.29 kg/m   Physical Exam  Constitutional: He is oriented to person, place, and time. He appears well-developed.  HENT:  Head: Normocephalic.  Eyes: Conjunctivae and EOM are normal. No scleral icterus.  Neck: Neck supple. No thyromegaly present.  Cardiovascular: Normal rate and regular rhythm. Exam reveals no gallop and no friction rub.  No murmur heard. Pulmonary/Chest: No stridor. He has no wheezes. He has no rales. He exhibits no tenderness.  Abdominal: He exhibits no distension. There is no tenderness. There is no rebound.  Musculoskeletal: Normal range of motion. He exhibits no edema.  Lymphadenopathy:    He has no cervical adenopathy.  Neurological: He is oriented to person, place, and time. He exhibits normal muscle tone. Coordination normal.  Skin: No rash noted. No erythema.  Psychiatric: He has a normal mood and affect. His behavior is normal.     ED Treatments / Results  Labs (all labs ordered are listed, but only abnormal results are displayed) Labs Reviewed  HEPATIC FUNCTION PANEL - Abnormal; Notable for the following components:      Result Value   Indirect Bilirubin 1.0 (*)    All other components within normal limits  BASIC METABOLIC PANEL  CBC  DIFFERENTIAL  D-DIMER, QUANTITATIVE (NOT [facility])  I-STAT  TROPONIN, ED  I-STAT TROPONIN, ED    EKG  EKG Interpretation  Date/Time:  Wednesday February 15 2018 11:06:30 EDT Ventricular Rate:  82 PR Interval:  140 QRS Duration: 92 QT Interval:  358 QTC Calculation: 418 R Axis:   94 Text Interpretation:  Normal sinus rhythm Rightward axis Borderline ECG Confirmed by Bethann Berkshire [phone]) on 02/15/2018 3:44:24 PM       Radiology No results found.  Procedures Procedures (including critical care time)  Medications Ordered in ED Medications  morphine 2 MG/ML injection 2 mg (2 mg Intravenous Given  02/15/18 1350)     Initial Impression / Assessment and Plan / ED Course  I have reviewed the triage vital signs and the nursing notes.  Pertinent labs & imaging results that were available during my care of the patient were reviewed by me and considered in my medical decision making (see chart for details).    Patient with atypical chest pain.  Troponin x2 is negative CBC chemistries negative d-dimer is negative chest x-ray any EKG unremarkable.  Patient with atypical chest pain doubt coronary artery disease.  He will be discharged home to follow-up with PCP  Final Clinical Impressions(s) / ED Diagnoses   Final diagnoses:  Atypical chest pain    ED Discharge Orders    None       Bethann Berkshire, MD 02/17/18 1258

## 2019-12-18 IMAGING — DX DG CHEST 2V
2 series · 2 of 2 positions shown · non-contrast
Comparison: 01/18/2017

CLINICAL DATA: Left chest pain extending into the neck

EXAM:
CHEST - 2 VIEW

[chest pa]
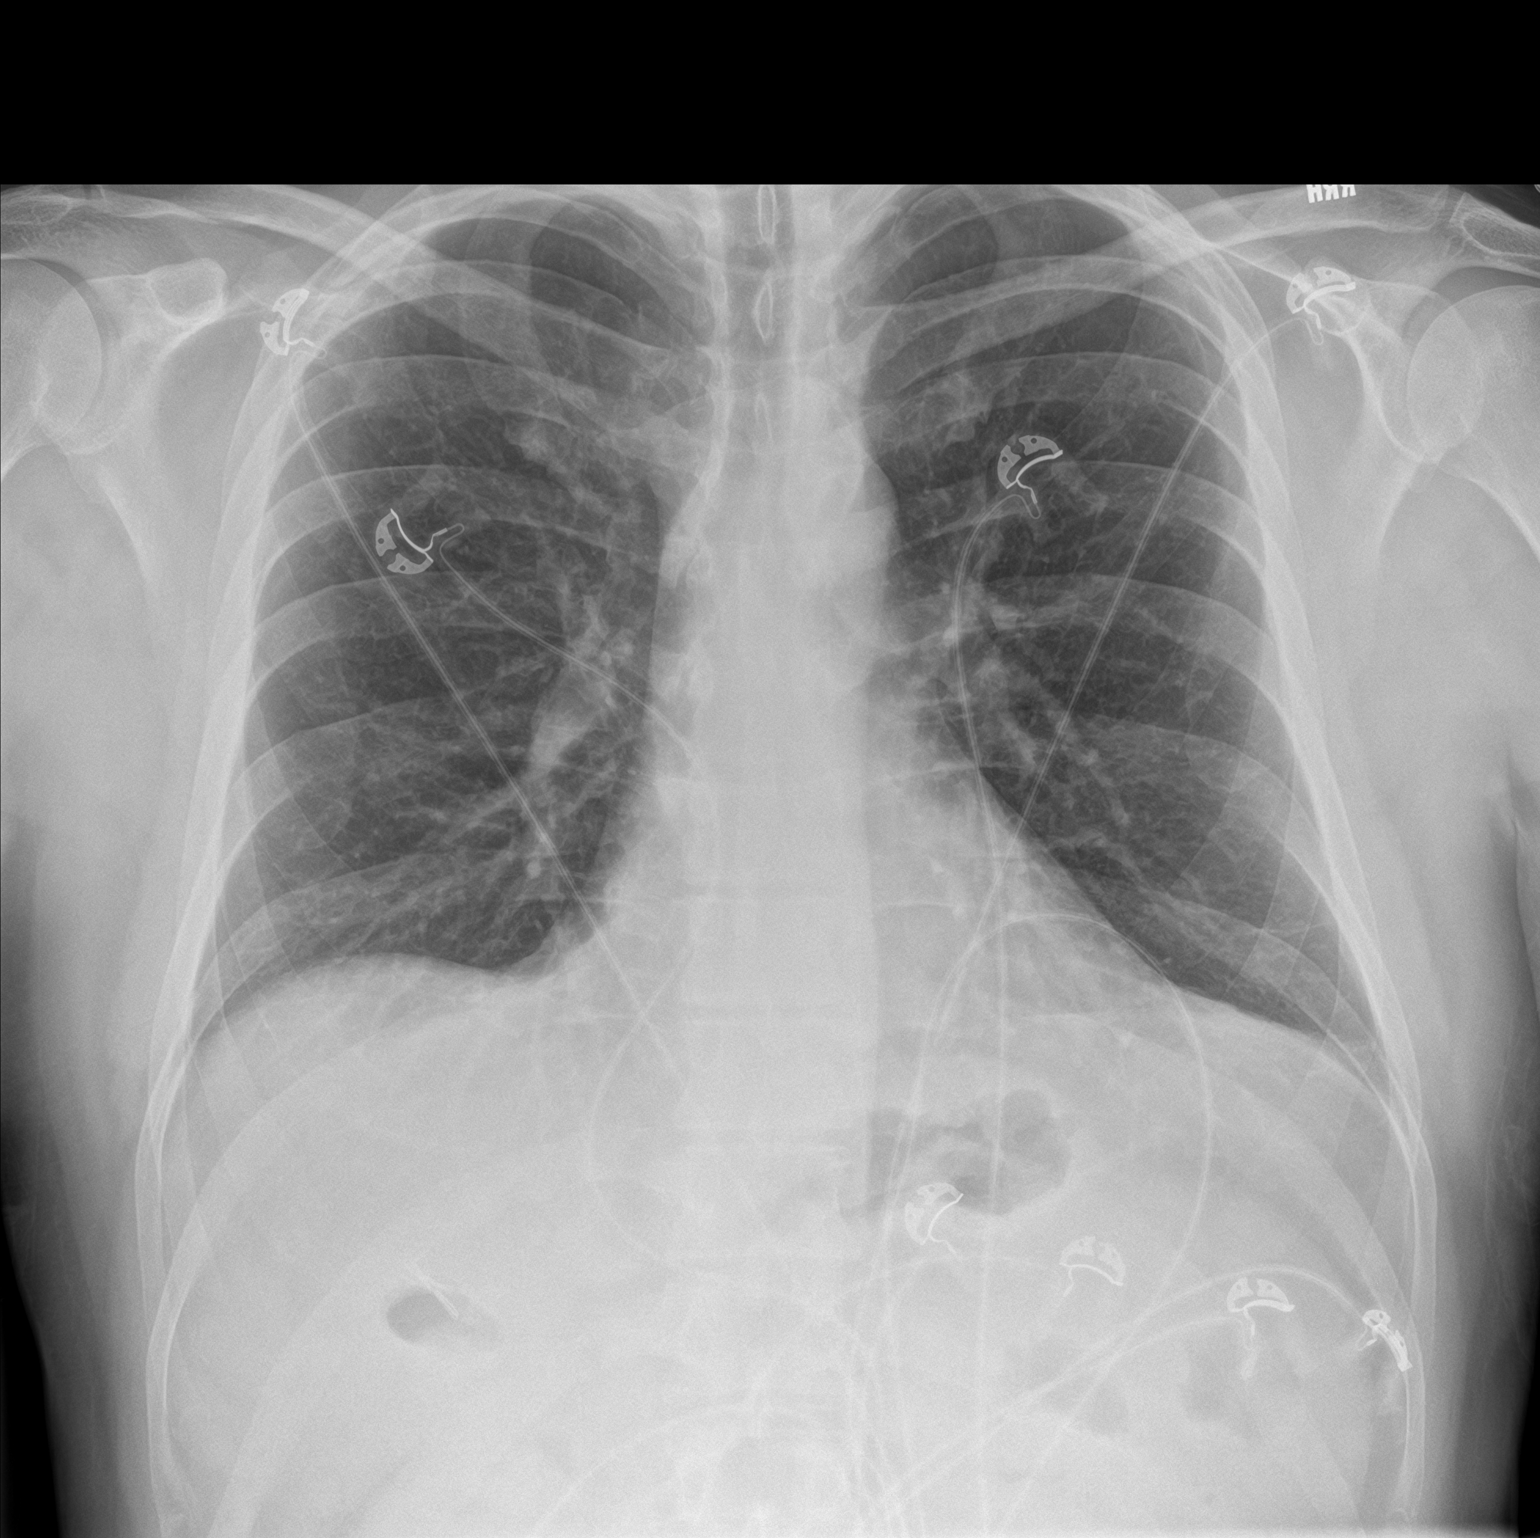

[chest lat]
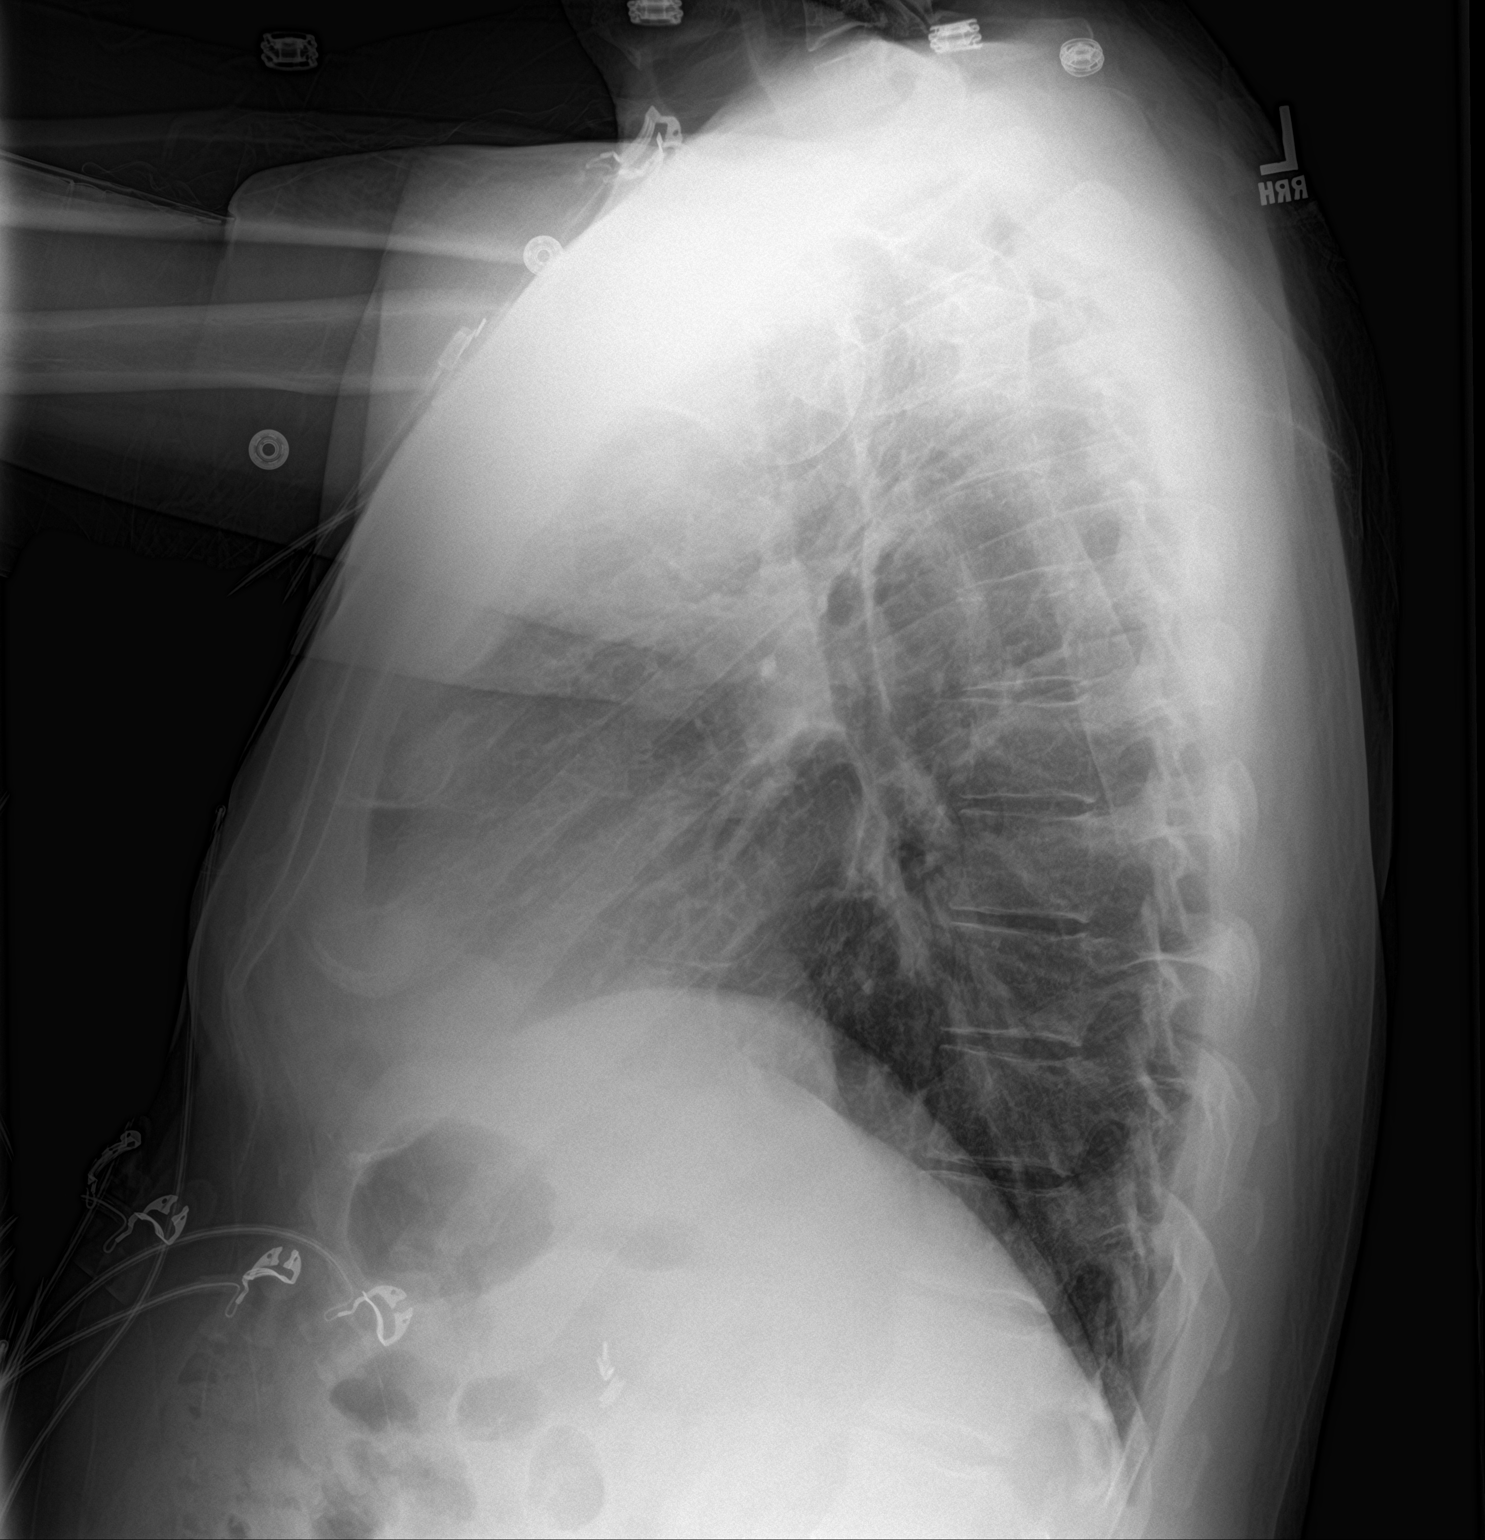

[2 of 2 positions shown; findings below may reference images not displayed]

FINDINGS: Low lung volumes are present, causing crowding of the pulmonary
vasculature. Subsegmental atelectasis along both lung bases.

Cardiac and mediastinal margins appear normal. No pleural effusion.
The lungs appear otherwise clear.
IMPRESSION: 1. Minimal bibasilar subsegmental atelectasis.

## 2023-06-23 ENCOUNTER — Other Ambulatory Visit: Payer: Self-pay

## 2023-06-23 ENCOUNTER — Emergency Department (HOSPITAL_BASED_OUTPATIENT_CLINIC_OR_DEPARTMENT_OTHER): Payer: Commercial Managed Care - PPO

## 2023-06-23 ENCOUNTER — Observation Stay (HOSPITAL_COMMUNITY): Payer: Commercial Managed Care - PPO

## 2023-06-23 ENCOUNTER — Encounter (HOSPITAL_BASED_OUTPATIENT_CLINIC_OR_DEPARTMENT_OTHER): Payer: Self-pay

## 2023-06-23 ENCOUNTER — Observation Stay (HOSPITAL_BASED_OUTPATIENT_CLINIC_OR_DEPARTMENT_OTHER)
Admission: EM | Admit: 2023-06-23 | Discharge: 2023-06-25 | Disposition: A | Discharge status: Home / Self Care | Payer: Commercial Managed Care - PPO | Attending: Emergency Medicine | Admitting: Emergency Medicine

## 2023-06-23 DIAGNOSIS — I10 Essential (primary) hypertension: Secondary | ICD-10-CM | POA: Diagnosis present

## 2023-06-23 DIAGNOSIS — R6510 Systemic inflammatory response syndrome (SIRS) of non-infectious origin without acute organ dysfunction: Secondary | ICD-10-CM | POA: Diagnosis not present

## 2023-06-23 DIAGNOSIS — N452 Orchitis: Secondary | ICD-10-CM | POA: Insufficient documentation

## 2023-06-23 DIAGNOSIS — E785 Hyperlipidemia, unspecified: Secondary | ICD-10-CM | POA: Diagnosis present

## 2023-06-23 DIAGNOSIS — A419 Sepsis, unspecified organism: Principal | ICD-10-CM | POA: Insufficient documentation

## 2023-06-23 DIAGNOSIS — R300 Dysuria: Secondary | ICD-10-CM | POA: Diagnosis present

## 2023-06-23 DIAGNOSIS — Z87442 Personal history of urinary calculi: Secondary | ICD-10-CM | POA: Diagnosis not present

## 2023-06-23 DIAGNOSIS — R10813 Right lower quadrant abdominal tenderness: Secondary | ICD-10-CM | POA: Diagnosis present

## 2023-06-23 DIAGNOSIS — R109 Unspecified abdominal pain: Secondary | ICD-10-CM

## 2023-06-23 LAB — URINALYSIS, ROUTINE W REFLEX MICROSCOPIC
Bacteria, UA: NONE SEEN
Bilirubin Urine: NEGATIVE
Glucose, UA: NEGATIVE mg/dL
Ketones, ur: NEGATIVE mg/dL
Leukocytes,Ua: NEGATIVE
Nitrite: NEGATIVE
Protein, ur: NEGATIVE mg/dL
Specific Gravity, Urine: 1.022 (ref 1.005–1.030)
pH: 6 (ref 5.0–8.0)

## 2023-06-23 LAB — CBC
HCT: 46.2 % (ref 39.0–52.0)
HCT: 43.4 % (ref 39.0–52.0)
Hemoglobin: 16.4 g/dL (ref 13.0–17.0)
Hemoglobin: 14.9 g/dL (ref 13.0–17.0)
MCH: 30.7 pg (ref 26.0–34.0)
MCH: 30.5 pg (ref 26.0–34.0)
MCHC: 35.5 g/dL (ref 30.0–36.0)
MCHC: 34.3 g/dL (ref 30.0–36.0)
MCV: 86.4 fL (ref 80.0–100.0)
MCV: 88.9 fL (ref 80.0–100.0)
Platelets: 194 10*3/uL (ref 150–400)
Platelets: 150 10*3/uL (ref 150–400)
RBC: 5.35 MIL/uL (ref 4.22–5.81)
RBC: 4.88 MIL/uL (ref 4.22–5.81)
RDW: 12.4 % (ref 11.5–15.5)
RDW: 12.5 % (ref 11.5–15.5)
WBC: 13.5 10*3/uL — ABNORMAL HIGH (ref 4.0–10.5)
WBC: 10.3 10*3/uL (ref 4.0–10.5)
nRBC: 0 % (ref 0.0–0.2)
nRBC: 0 % (ref 0.0–0.2)

## 2023-06-23 LAB — COMPREHENSIVE METABOLIC PANEL
ALT: 25 U/L (ref 0–44)
AST: 28 U/L (ref 15–41)
Albumin: 4.8 g/dL (ref 3.5–5.0)
Alkaline Phosphatase: 58 U/L (ref 38–126)
Anion gap: 9 (ref 5–15)
BUN: 14 mg/dL (ref 6–20)
CO2: 23 mmol/L (ref 22–32)
Calcium: 9.7 mg/dL (ref 8.9–10.3)
Chloride: 102 mmol/L (ref 98–111)
Creatinine, Ser: 1.15 mg/dL (ref 0.61–1.24)
GFR, Estimated: >60 mL/min (ref >60)
Glucose, Bld: 94 mg/dL (ref 70–99)
Potassium: 3.8 mmol/L (ref 3.5–5.1)
Sodium: 134 mmol/L — ABNORMAL LOW (ref 135–145)
Total Bilirubin: 1 mg/dL (ref 0.3–1.2)
Total Protein: 8.1 g/dL (ref 6.5–8.1)

## 2023-06-23 LAB — PROTIME-INR
INR: 1.2 (ref 0.8–1.2)
Prothrombin Time: 14.9 seconds (ref 11.4–15.2)

## 2023-06-23 LAB — LACTIC ACID, PLASMA
Lactic Acid, Venous: 1.9 mmol/L (ref 0.5–1.9)
Lactic Acid, Venous: 0.8 mmol/L (ref 0.5–1.9)

## 2023-06-23 LAB — CREATININE, SERUM
Creatinine, Ser: 1.09 mg/dL (ref 0.61–1.24)
GFR, Estimated: >60 mL/min (ref >60)

## 2023-06-23 LAB — LIPASE, BLOOD: Lipase: 34 U/L (ref 11–51)

## 2023-06-23 LAB — APTT: aPTT: 32 seconds (ref 24–36)

## 2023-06-23 MED ORDER — OXYCODONE HCL 5 MG PO TABS
5.0000 mg | ORAL_TABLET | ORAL | Status: DC | PRN
  Administered 2023-06-23 – 2023-06-24 (×2): 5 mg via ORAL
  Filled 2023-06-23 (×3): qty 1

## 2023-06-23 MED ORDER — VANCOMYCIN HCL IN DEXTROSE 1-5 GM/200ML-% IV SOLN
1000.0000 mg | Freq: Once | INTRAVENOUS | Status: AC
  Administered 2023-06-23: 1000 mg via INTRAVENOUS
  Filled 2023-06-23: qty 200

## 2023-06-23 MED ORDER — FENTANYL CITRATE PF 50 MCG/ML IJ SOSY
50.0000 ug | PREFILLED_SYRINGE | Freq: Once | INTRAMUSCULAR | Status: AC
  Administered 2023-06-23: 50 ug via INTRAVENOUS
  Filled 2023-06-23: qty 1

## 2023-06-23 MED ORDER — CELECOXIB 200 MG PO CAPS
200.0000 mg | ORAL_CAPSULE | Freq: Two times a day (BID) | ORAL | Status: AC
  Administered 2023-06-23 – 2023-06-25 (×4): 200 mg via ORAL
  Filled 2023-06-23 (×5): qty 1

## 2023-06-23 MED ORDER — SODIUM CHLORIDE 0.9 % IV SOLN: INTRAVENOUS | Status: DC

## 2023-06-23 MED ORDER — ACETAMINOPHEN 325 MG PO TABS
650.0000 mg | ORAL_TABLET | Freq: Once | ORAL | Status: AC
  Administered 2023-06-23: 650 mg via ORAL
  Filled 2023-06-23: qty 2

## 2023-06-23 MED ORDER — SODIUM CHLORIDE 0.9 % IV BOLUS
1000.0000 mL | Freq: Once | INTRAVENOUS | Status: AC
Start: 2023-06-23 — End: 2023-06-23
  Administered 2023-06-23: 1000 mL via INTRAVENOUS

## 2023-06-23 MED ORDER — SODIUM CHLORIDE 0.9 % IV SOLN
2.0000 g | Freq: Once | INTRAVENOUS | Status: AC
  Administered 2023-06-23: 2 g via INTRAVENOUS
  Filled 2023-06-23: qty 12.5

## 2023-06-23 MED ORDER — LEVOFLOXACIN IN D5W 500 MG/100ML IV SOLN
500.0000 mg | INTRAVENOUS | Status: DC
  Administered 2023-06-23 – 2023-06-24 (×2): 500 mg via INTRAVENOUS
  Filled 2023-06-23 (×2): qty 100

## 2023-06-23 MED ORDER — SODIUM CHLORIDE 0.9 % IV SOLN
1.0000 g | Freq: Once | INTRAVENOUS | Status: AC
  Administered 2023-06-23: 1 g via INTRAVENOUS
  Filled 2023-06-23: qty 10

## 2023-06-23 MED ORDER — POLYETHYLENE GLYCOL 3350 17 G PO PACK
17.0000 g | PACK | Freq: Every day | ORAL | Status: DC | PRN
  Administered 2023-06-25: 17 g via ORAL
  Filled 2023-06-23: qty 1

## 2023-06-23 MED ORDER — ENOXAPARIN SODIUM 60 MG/0.6ML IJ SOSY
50.0000 mg | PREFILLED_SYRINGE | INTRAMUSCULAR | Status: DC
  Administered 2023-06-24 – 2023-06-25 (×2): 50 mg via SUBCUTANEOUS
  Filled 2023-06-23 (×2): qty 0.6

## 2023-06-23 MED ORDER — KETOROLAC TROMETHAMINE 15 MG/ML IJ SOLN
15.0000 mg | Freq: Once | INTRAMUSCULAR | Status: AC
  Administered 2023-06-23: 15 mg via INTRAVENOUS
  Filled 2023-06-23: qty 1

## 2023-06-23 MED ORDER — LACTATED RINGERS IV BOLUS (SEPSIS)
1000.0000 mL | Freq: Once | INTRAVENOUS | Status: AC
  Administered 2023-06-23: 1000 mL via INTRAVENOUS

## 2023-06-23 MED ORDER — VANCOMYCIN HCL IN DEXTROSE 1-5 GM/200ML-% IV SOLN
1000.0000 mg | Freq: Once | INTRAVENOUS | Status: AC
  Administered 2023-06-24: 1000 mg via INTRAVENOUS
  Filled 2023-06-23: qty 200

## 2023-06-23 MED ORDER — ONDANSETRON HCL 4 MG/2ML IJ SOLN
4.0000 mg | Freq: Four times a day (QID) | INTRAMUSCULAR | Status: DC | PRN
  Administered 2023-06-24 (×3): 4 mg via INTRAVENOUS
  Filled 2023-06-23 (×4): qty 2

## 2023-06-23 MED ORDER — ONDANSETRON HCL 4 MG/2ML IJ SOLN
4.0000 mg | Freq: Once | INTRAMUSCULAR | Status: AC
  Administered 2023-06-23: 4 mg via INTRAVENOUS
  Filled 2023-06-23: qty 2

## 2023-06-23 MED ORDER — SODIUM CHLORIDE 0.9% FLUSH
3.0000 mL | Freq: Two times a day (BID) | INTRAVENOUS | Status: DC
  Administered 2023-06-23 – 2023-06-25 (×4): 3 mL via INTRAVENOUS

## 2023-06-23 MED ORDER — ACETAMINOPHEN 325 MG PO TABS
650.0000 mg | ORAL_TABLET | ORAL | Status: DC
  Administered 2023-06-23 – 2023-06-25 (×8): 650 mg via ORAL
  Filled 2023-06-23 (×8): qty 2

## 2023-06-23 MED ORDER — IBUPROFEN 800 MG PO TABS
800.0000 mg | ORAL_TABLET | Freq: Once | ORAL | Status: AC
  Administered 2023-06-23: 800 mg via ORAL
  Filled 2023-06-23: qty 1

## 2023-06-23 NOTE — ED Provider Notes (Signed)
[place] EMERGENCY DEPARTMENT AT [facility] Provider Note   CSN: [id number] Arrival date & time: 06/23/23  1143     History Chief Complaint  Patient presents with   Flank Pain    Jimmy Serrano is a 42 y.o. male patient with history of kidney stones who presents to the emergency department today with right-sided flank pain that radiates into the right lower quadrant abdomen and testicle.  Symptoms started on Monday but has significantly worsened over the last 48 hours.  He endorses a 101 fever at home, dysuria, and constant right-sided throbbing flank pain.  Denies nausea, vomiting, diarrhea.   Flank Pain       Home Medications Prior to Admission medications   Not on File      Allergies    Patient has no known allergies.    Review of Systems   Review of Systems  Genitourinary:  Positive for flank pain.  All other systems reviewed and are negative.   Physical Exam Updated Vital Signs BP 132/82   Pulse (!) 111   Temp (!) 103.3 F (39.6 C) (Oral)   Resp 18   Ht 6' (1.829 m)   Wt 102.5 kg   SpO2 96%   BMI 30.65 kg/m  Physical Exam Vitals and nursing note reviewed.  Constitutional:      General: He is not in acute distress.    Appearance: Normal appearance.  HENT:     Head: Normocephalic and atraumatic.  Eyes:     General:        Right eye: No discharge.        Left eye: No discharge.  Cardiovascular:     Comments: Regular rate and rhythm.  S1/S2 are distinct without any evidence of murmur, rubs, or gallops.  Radial pulses are 2+ bilaterally.  Dorsalis pedis pulses are 2+ bilaterally.  No evidence of pedal edema. Pulmonary:     Comments: Clear to auscultation bilaterally.  Normal effort.  No respiratory distress.  No evidence of wheezes, rales, or rhonchi heard throughout. Abdominal:     General: Abdomen is flat. Bowel sounds are normal. There is no distension.     Tenderness: There is abdominal tenderness in the right lower quadrant. There  is right CVA tenderness. There is no guarding or rebound.  Musculoskeletal:        General: Normal range of motion.     Cervical back: Neck supple.  Skin:    General: Skin is warm and dry.     Findings: No rash.  Neurological:     General: No focal deficit present.     Mental Status: He is alert.  Psychiatric:        Mood and Affect: Mood normal.        Behavior: Behavior normal.     ED Results / Procedures / Treatments   Labs (all labs ordered are listed, but only abnormal results are displayed) Labs Reviewed  COMPREHENSIVE METABOLIC PANEL - Abnormal; Notable for the following components:      Result Value   Sodium 134 (*)    All other components within normal limits  CBC - Abnormal; Notable for the following components:   WBC 13.5 (*)    All other components within normal limits  URINALYSIS, ROUTINE W REFLEX MICROSCOPIC - Abnormal; Notable for the following components:   Hgb urine dipstick SMALL (*)    All other components within normal limits  CULTURE, BLOOD (ROUTINE X 2)  CULTURE, BLOOD (ROUTINE X 2)  LIPASE, BLOOD  LACTIC ACID, PLASMA  PROTIME-INR  APTT  LACTIC ACID, PLASMA    EKG None  Radiology DG Chest Port 1 View  Result Date: 06/23/2023 CLINICAL DATA:  Questionable sepsis. EXAM: PORTABLE CHEST 1 VIEW COMPARISON:  Chest x-ray February 15, 2018. FINDINGS: The heart size and mediastinal contours are within normal limits. Both lungs are clear. No visible pleural effusions or pneumothorax. No acute osseous abnormality. Cholecystectomy clips. IMPRESSION: No active disease. Electronically Signed   By: Feliberto Harts M.D.   On: 06/23/2023 17:18   CT Renal Stone Study  Result Date: 06/23/2023 CLINICAL DATA:  Acute right flank pain. EXAM: CT ABDOMEN AND PELVIS WITHOUT CONTRAST TECHNIQUE: Multidetector CT imaging of the abdomen and pelvis was performed following the standard protocol without IV contrast. RADIATION DOSE REDUCTION: This exam was performed according to  the departmental dose-optimization program which includes automated exposure control, adjustment of the mA and/or kV according to patient size and/or use of iterative reconstruction technique. COMPARISON:  Apr 08, 2011. FINDINGS: Lower chest: No acute abnormality. Hepatobiliary: Status post cholecystectomy. No biliary dilatation is noted. Probable hepatic steatosis. Pancreas: Unremarkable. No pancreatic ductal dilatation or surrounding inflammatory changes. Spleen: Normal in size without focal abnormality. Adrenals/Urinary Tract: Adrenal glands are unremarkable. Kidneys are normal, without renal calculi, focal lesion, or hydronephrosis. Bladder is unremarkable. Stomach/Bowel: Stomach is within normal limits. Appendix appears normal. No evidence of bowel wall thickening, distention, or inflammatory changes. Vascular/Lymphatic: No significant vascular findings are present. No enlarged abdominal or pelvic lymph nodes. Reproductive: Prostate is unremarkable. Other: No abdominal wall hernia or abnormality. No abdominopelvic ascites. Musculoskeletal: No acute or significant osseous findings. IMPRESSION: Probable hepatic steatosis. No other abnormality seen in the abdomen or pelvis. Electronically Signed   By: Lupita Raider M.D.   On: 06/23/2023 13:04    Procedures .Critical Care  Performed by: Teressa Lower, PA-C Authorized by: Teressa Lower, PA-C   Critical care provider statement:    Critical care time (minutes):  35   Critical care time was exclusive of:  Separately billable procedures and treating other patients   Critical care was necessary to treat or prevent imminent or life-threatening deterioration of the following conditions:  Sepsis   Critical care was time spent personally by me on the following activities:  Blood draw for specimens, development of treatment plan with patient or surrogate, ordering and performing treatments and interventions, ordering and review of laboratory studies and  ordering and review of radiographic studies     Medications Ordered in ED Medications  vancomycin (VANCOCIN) IVPB 1000 mg/200 mL premix (has no administration in time range)    Followed by  vancomycin (VANCOCIN) IVPB 1000 mg/200 mL premix (has no administration in time range)  ceFEPIme (MAXIPIME) 2 g in sodium chloride 0.9 % 100 mL IVPB (has no administration in time range)  sodium chloride 0.9 % bolus 1,000 mL (0 mLs Intravenous Stopped 06/23/23 1503)  ketorolac (TORADOL) 15 MG/ML injection 15 mg (15 mg Intravenous Given 06/23/23 1410)  ondansetron (ZOFRAN) injection 4 mg (4 mg Intravenous Given 06/23/23 1422)  fentaNYL (SUBLIMAZE) injection 50 mcg (50 mcg Intravenous Given 06/23/23 1534)  lactated ringers bolus 1,000 mL (1,000 mLs Intravenous New Bag/Given 06/23/23 1715)  acetaminophen (TYLENOL) tablet 650 mg (650 mg Oral Given 06/23/23 1715)  ondansetron (ZOFRAN) injection 4 mg (4 mg Intravenous Given 06/23/23 1831)  ibuprofen (ADVIL) tablet 800 mg (800 mg Oral Given 06/23/23 1831)    ED Course/ Medical Decision Making/ A&P Clinical Course  as of 06/23/23 1839  Thu Jun 23, 2023  1700 Patient is still tachycardic despite fluids.  Rectal temperature is 103.3 and I will broaden out sepsis and plan to admit him to the hospital. [CF]  1834 CBC(!) There is evidence of leukocytosis. [CF]  1834 Urinalysis, Routine w reflex microscopic -Urine, Clean Catch(!) Normal. [CF]  1834 Protime-INR Normal. [CF]  1834 Lipase, blood Normal. [CF]  1834 Lactic acid, plasma Normal.  [CF]  1835 APTT Normal.  [CF]  1835 DG Chest [facility] I personally ordered and interpreted this study and  [CF]  1836 CT Renal Stone Study I personally ordered interpreted the study there is no evidence of ureteral stone or hydronephrosis or [place].  I do agree with radiology interpretation. [CF]    Clinical Course User Index [CF] Teressa Lower, PA-C   {   Click here for ABCD2, HEART and other calculators  Medical  Decision Making KAILIN LEU is a 42 y.o. male patient who presents to the emergency department today for further evaluation of right-sided flank pain.  Patient is tachycardic here and given the patient's fever yesterday and concern for possible septic stone.  Will get labs here in addition to a CT renal stone study.  If positive we will plan to reach out to urology.  I will give the patient some fluids.  He has already had anti-inflammatories from urgent care prior to arrival.  Patient was persistently tachycardic despite overall feeling better from a pain perspective.  We then got an rectal temperature which revealed a temperature of 103 which explains why he is feeling so poorly.  I broadened out labs to account for positive SIRS criteria and likely sepsis of unknown source.  Patient did have an episode where he had some very painful urination and since then his pain started improving.  This could be that he passed a stone however this is not necessarily explain why his urinalysis is negative if this truly was a septic stone.  Nonetheless, we will give him broad-spectrum antibiotics and likely bring him into the hospital for further evaluation and management.  Patient is on board with plan.  All questions or concerns addressed.  Amount and/or Complexity of Data Reviewed Labs: ordered. Decision-making details documented in ED Course. Radiology: ordered. Decision-making details documented in ED Course. ECG/medicine tests: ordered.  Risk OTC drugs. Prescription drug management. Decision regarding hospitalization.    Final Clinical Impression(s) / ED Diagnoses Final diagnoses:  Sepsis without acute organ dysfunction, due to unspecified organism [facility])  Right flank pain    Rx / DC Orders ED Discharge Orders     None         Teressa Lower, [place] 06/23/23 1839    Margarita Grizzle, MD 06/29/23 [phone]

## 2023-06-23 NOTE — Progress Notes (Signed)
ED Pharmacy Antibiotic Sign Off An antibiotic consult was received from an ED provider for cefepime and vancomycin per pharmacy dosing for bacteremia. A chart review was completed to assess appropriateness.  The following one time order(s) were placed per pharmacy consult:  cefepime 2000 mg x 1 dose vancomycin 2000 mg x 1 dose  Further antibiotic and/or antibiotic pharmacy consults should be ordered by the admitting provider if indicated.   Thank you for allowing pharmacy to be a part of this patient's care.   Delmar Landau, PharmD, BCPS 06/23/2023 6:34 PM ED Clinical Pharmacist -  [phone]

## 2023-06-23 NOTE — ED Triage Notes (Signed)
Patient here POV from Home.  Endorses Right Flank Pain that began 2-3 Days ago. Since then has begun to radiates to Right Lower ABD and Groin Area.  Frequency and mild dysuria. Some Nausea. 101 Fever yesterday and 102 Fever today.   NAD Noted during Triage. A&Ox4. CGS 15. Ambulatory.

## 2023-06-23 NOTE — ED Notes (Signed)
Kylie at [facility] will send transport after shift change. Bed Ready at [facility] 5E #[id number](NS)

## 2023-06-23 NOTE — H&P (Addendum)
History and Physical    Patient: Jimmy Serrano AOZ:[id number] DOB: 15-May-1981 DOA: 06/23/2023 DOS: the patient was seen and examined on 06/23/2023 PCP: Rick Duff, PA-C  Patient coming from: Home  Chief Complaint:  Chief Complaint  Patient presents with   Flank Pain   HPI: Jimmy Serrano is a 42 y.o. male with medical history significant of remote kidney stones.  Patient was in his usual state of health till about 3 days ago when he started having right flank pain radiating towards the groin.  For the last 24 hours patient has had right testicular/scrotal area swelling/pain associated with marked prostration, fever up to 103 F poor appetite nausea and sensation of dysuria.  There is no penile discharge or urine discoloration reported.  Patient initially evaluated at outside ER, where patient was found to be septic s/p vancomycin and cefepime.  Patient clinically improved but no focal infection identified Review of Systems: As mentioned in the history of present illness. All other systems reviewed and are negative. Past Medical History:  Diagnosis Date   Kidney stones    Past Surgical History:  Procedure Laterality Date   CHOLECYSTECTOMY     Social History:  reports that he has never smoked. He has never used smokeless tobacco. He reports that he does not drink alcohol and does not use drugs.  No Known Allergies  History reviewed. No pertinent family history.  Prior to Admission medications   Not on File    Physical Exam: Vitals:   06/23/23 1845 06/23/23 1915 06/23/23 2005 06/23/23 2005  BP: 120/73 127/75 135/82 135/82  Pulse: (!) 115 (!) 106 99 99  Resp: (!) 30 17 16 16   Temp:   99.8 F (37.7 C) 99.8 F (37.7 C)  TempSrc:   Oral Oral  SpO2: 95% 96% 97% 97%  Weight:      Height:       Neural: Patient is alert awake does not appear to be in any distress Respiratory exam: Bilateral intravesicular Cardiovascular normal/normal Abdomen soft nontender no  costovertebral angle tenderness Scrotal exam no penile discharge could be expressed.  There is tenderness over the right testicle occasional mild focal tenderness in the posterior aspect. Data Reviewed:  Labs on Admission:  Results for orders placed or performed during the hospital encounter of 06/23/23 (from the past 24 hour(s))  Lipase, blood     Status: None   Collection Time: 06/23/23 11:56 AM  Result Value Ref Range   Lipase 34 11 - 51 U/L  Comprehensive metabolic panel     Status: Abnormal   Collection Time: 06/23/23 11:56 AM  Result Value Ref Range   Sodium 134 (L) 135 - 145 mmol/L   Potassium 3.8 3.5 - 5.1 mmol/L   Chloride 102 98 - 111 mmol/L   CO2 23 22 - 32 mmol/L   Glucose, Bld 94 70 - 99 mg/dL   BUN 14 6 - 20 mg/dL   Creatinine, Ser 9.62 0.61 - 1.24 mg/dL   Calcium 9.7 8.9 - 95.2 mg/dL   Total Protein 8.1 6.5 - 8.1 g/dL   Albumin 4.8 3.5 - 5.0 g/dL   AST 28 15 - 41 U/L   ALT 25 0 - 44 U/L   Alkaline Phosphatase 58 38 - 126 U/L   Total Bilirubin 1.0 0.3 - 1.2 mg/dL   GFR, Estimated >84 >13 mL/min   Anion gap 9 5 - 15  CBC     Status: Abnormal   Collection Time: 06/23/23 11:56  AM  Result Value Ref Range   WBC 13.5 (H) 4.0 - 10.5 K/uL   RBC 5.35 4.22 - 5.81 MIL/uL   Hemoglobin 16.4 13.0 - 17.0 g/dL   HCT 69.6 29.5 - 28.4 %   MCV 86.4 80.0 - 100.0 fL   MCH 30.7 26.0 - 34.0 pg   MCHC 35.5 30.0 - 36.0 g/dL   RDW 13.2 44.0 - 10.2 %   Platelets 194 150 - 400 K/uL   nRBC 0.0 0.0 - 0.2 %  Urinalysis, Routine w reflex microscopic -Urine, Clean Catch     Status: Abnormal   Collection Time: 06/23/23  2:19 PM  Result Value Ref Range   Color, Urine YELLOW YELLOW   APPearance CLEAR CLEAR   Specific Gravity, Urine 1.022 1.005 - 1.030   pH 6.0 5.0 - 8.0   Glucose, UA NEGATIVE NEGATIVE mg/dL   Hgb urine dipstick SMALL (A) NEGATIVE   Bilirubin Urine NEGATIVE NEGATIVE   Ketones, ur NEGATIVE NEGATIVE mg/dL   Protein, ur NEGATIVE NEGATIVE mg/dL   Nitrite NEGATIVE NEGATIVE    Leukocytes,Ua NEGATIVE NEGATIVE   RBC / HPF 0-5 0 - 5 RBC/hpf   WBC, UA 0-5 0 - 5 WBC/hpf   Bacteria, UA NONE SEEN NONE SEEN   Squamous Epithelial / HPF 0-5 0 - 5 /HPF   Mucus PRESENT    Hyaline Casts, UA PRESENT   Lactic acid, plasma     Status: None   Collection Time: 06/23/23  5:33 PM  Result Value Ref Range   Lactic Acid, Venous 1.9 0.5 - 1.9 mmol/L  Protime-INR     Status: None   Collection Time: 06/23/23  5:33 PM  Result Value Ref Range   Prothrombin Time 14.9 11.4 - 15.2 seconds   INR 1.2 0.8 - 1.2  APTT     Status: None   Collection Time: 06/23/23  5:33 PM  Result Value Ref Range   aPTT 32 24 - 36 seconds   Basic Metabolic Panel: Recent Labs  Lab 06/23/23 1156  NA 134*  K 3.8  CL 102  CO2 23  GLUCOSE 94  BUN 14  CREATININE 1.15  CALCIUM 9.7   Liver Function Tests: Recent Labs  Lab 06/23/23 1156  AST 28  ALT 25  ALKPHOS 58  BILITOT 1.0  PROT 8.1  ALBUMIN 4.8   Recent Labs  Lab 06/23/23 1156  LIPASE 34   No results for input(s): "AMMONIA" in the last 168 hours. CBC: Recent Labs  Lab 06/23/23 1156  WBC 13.5*  HGB 16.4  HCT 46.2  MCV 86.4  PLT 194   Cardiac Enzymes: No results for input(s): "CKTOTAL", "CKMB", "CKMBINDEX", "TROPONINIHS" in the last 168 hours.  BNP (last 3 results) No results for input(s): "PROBNP" in the last 8760 hours. CBG: No results for input(s): "GLUCAP" in the last 168 hours.  Radiological Exams on Admission:  DG Chest Port 1 View  Result Date: 06/23/2023 CLINICAL DATA:  Questionable sepsis. EXAM: PORTABLE CHEST 1 VIEW COMPARISON:  Chest x-ray February 15, 2018. FINDINGS: The heart size and mediastinal contours are within normal limits. Both lungs are clear. No visible pleural effusions or pneumothorax. No acute osseous abnormality. Cholecystectomy clips. IMPRESSION: No active disease. Electronically Signed   By: Feliberto Harts M.D.   On: 06/23/2023 17:18   CT Renal Stone Study  Result Date: 06/23/2023 CLINICAL  DATA:  Acute right flank pain. EXAM: CT ABDOMEN AND PELVIS WITHOUT CONTRAST TECHNIQUE: Multidetector CT imaging of the abdomen and pelvis  was performed following the standard protocol without IV contrast. RADIATION DOSE REDUCTION: This exam was performed according to the departmental dose-optimization program which includes automated exposure control, adjustment of the mA and/or kV according to patient size and/or use of iterative reconstruction technique. COMPARISON:  Apr 08, 2011. FINDINGS: Lower chest: No acute abnormality. Hepatobiliary: Status post cholecystectomy. No biliary dilatation is noted. Probable hepatic steatosis. Pancreas: Unremarkable. No pancreatic ductal dilatation or surrounding inflammatory changes. Spleen: Normal in size without focal abnormality. Adrenals/Urinary Tract: Adrenal glands are unremarkable. Kidneys are normal, without renal calculi, focal lesion, or hydronephrosis. Bladder is unremarkable. Stomach/Bowel: Stomach is within normal limits. Appendix appears normal. No evidence of bowel wall thickening, distention, or inflammatory changes. Vascular/Lymphatic: No significant vascular findings are present. No enlarged abdominal or pelvic lymph nodes. Reproductive: Prostate is unremarkable. Other: No abdominal wall hernia or abnormality. No abdominopelvic ascites. Musculoskeletal: No acute or significant osseous findings. IMPRESSION: Probable hepatic steatosis. No other abnormality seen in the abdomen or pelvis. Electronically Signed   By: Lupita Raider M.D.   On: 06/23/2023 13:04    EKG: Independently reviewed. Sinus tachycardia.   Assessment and Plan: Orchitis of right testicle Associated with sepsis, lactic acid within normal limit.  S/p vancomycin and cefepime.  Treat with ceftriaxone single dose and levofloxacin standing.  Blood cultures pending.  To rule out scrotal/testicular torsion, pending ultrasound ordered by author.  Patient not looking toxic anymore.  Pain  well-controlled. Pain meds orderd      Advance Care Planning:   Code Status: Not on file full code  Consults: None at this time  Family Communication: mother in law. was at the bedside for partial part of the encounter.  She stepped out for the exam  Severity of Illness: The appropriate patient status for this patient is INPATIENT. Inpatient status is judged to be reasonable and necessary in order to provide the required intensity of service to ensure the patient's safety. The patient's presenting symptoms, physical exam findings, and initial radiographic and laboratory data in the context of their chronic comorbidities is felt to place them at high risk for further clinical deterioration. Furthermore, it is not anticipated that the patient will be medically stable for discharge from the hospital within 2 midnights of admission.   * I certify that at the point of admission it is my clinical judgment that the patient will require inpatient hospital care spanning beyond 2 midnights from the point of admission due to high intensity of service, high risk for further deterioration and high frequency of surveillance required.*  Author: Nolberto Hanlon, MD 06/23/2023 8:57 PM  For on call review [email or web address].

## 2023-06-23 NOTE — Progress Notes (Signed)
Plan of Care Note for accepted transfer   Patient: Jimmy Serrano MRN: [id number]   DOA: 06/23/2023  Facility requesting transfer: [facility] Requesting Provider: Honor Loh, PA-C Reason for transfer: SIRS Facility course:  42 year old male with history of HTN, HLD, kidney stones who presented to the ED with 2-3 days of right flank pain radiating to RLQ and groin.  Has had reported dysuria, increased urinary frequency, and fevers at home.  Noted to be tachycardic with rectal temp 103.3 F and leukocytosis WBC 13.5.  Urinalysis negative for UTI.  Blood cultures ordered and pending.  CT renal stone study with probable hepatic steatosis, no other abnormality.  No renal calculi, no hydronephrosis, bladder is unremarkable, prostate unremarkable.  CXR negative for focal consolidation, edema, effusion.  Patient was given 2 L IV fluids, Toradol/fentanyl/Tylenol for pain.  Patient has orders to receive vancomycin and cefepime. Pain improved however due to meeting SIRS criteria admission was requested for further evaluation and management.  Plan of care: The patient is accepted for admission to Med-surg  unit, at [facility] or [facility], first available.   Author: Darreld Mclean, MD 06/23/2023  Check [email or web address] for on-call coverage.  Nursing staff, Please call [facility] Admits & Consults System-Wide number on Amion as soon as patient's arrival, so appropriate admitting provider can evaluate the pt.

## 2023-06-23 NOTE — Assessment & Plan Note (Addendum)
Associated with sepsis, lactic acid within normal limit.  S/p vancomycin and cefepime.  Treat with ceftriaxone single dose and levofloxacin standing.  Blood cultures pending.  To rule out scrotal/testicular torsion, pending ultrasound ordered by author.  Patient not looking toxic anymore.  Pain well-controlled. Pain meds orderd

## 2023-06-23 NOTE — ED Notes (Signed)
Report given to the next RN... 

## 2023-06-24 DIAGNOSIS — N452 Orchitis: Secondary | ICD-10-CM | POA: Diagnosis not present

## 2023-06-24 LAB — CBC
HCT: 44 % (ref 39.0–52.0)
Hemoglobin: 14.8 g/dL (ref 13.0–17.0)
MCH: 30.1 pg (ref 26.0–34.0)
MCHC: 33.6 g/dL (ref 30.0–36.0)
MCV: 89.6 fL (ref 80.0–100.0)
Platelets: 137 10*3/uL — ABNORMAL LOW (ref 150–400)
RBC: 4.91 MIL/uL (ref 4.22–5.81)
RDW: 12.6 % (ref 11.5–15.5)
WBC: 10.5 10*3/uL (ref 4.0–10.5)
nRBC: 0 % (ref 0.0–0.2)

## 2023-06-24 LAB — BASIC METABOLIC PANEL
Anion gap: 7 (ref 5–15)
BUN: 11 mg/dL (ref 6–20)
CO2: 20 mmol/L — ABNORMAL LOW (ref 22–32)
Calcium: 7.9 mg/dL — ABNORMAL LOW (ref 8.9–10.3)
Chloride: 107 mmol/L (ref 98–111)
Creatinine, Ser: 0.93 mg/dL (ref 0.61–1.24)
GFR, Estimated: >60 mL/min (ref >60)
Glucose, Bld: 101 mg/dL — ABNORMAL HIGH (ref 70–99)
Potassium: 4 mmol/L (ref 3.5–5.1)
Sodium: 134 mmol/L — ABNORMAL LOW (ref 135–145)

## 2023-06-24 LAB — PROTIME-INR
INR: 1.3 — ABNORMAL HIGH (ref 0.8–1.2)
Prothrombin Time: 16.1 seconds — ABNORMAL HIGH (ref 11.4–15.2)

## 2023-06-24 LAB — APTT: aPTT: 33 seconds (ref 24–36)

## 2023-06-24 LAB — HIV ANTIBODY (ROUTINE TESTING W REFLEX): HIV Screen 4th Generation wRfx: NONREACTIVE

## 2023-06-24 LAB — CULTURE, BLOOD (ROUTINE X 2)

## 2023-06-24 MED ORDER — OXYCODONE HCL 5 MG PO TABS
2.5000 mg | ORAL_TABLET | ORAL | Status: DC | PRN
  Administered 2023-06-24 – 2023-06-25 (×4): 5 mg via ORAL
  Filled 2023-06-24 (×3): qty 1

## 2023-06-24 MED ORDER — FENTANYL CITRATE PF 50 MCG/ML IJ SOSY
12.5000 ug | PREFILLED_SYRINGE | INTRAMUSCULAR | Status: DC | PRN
  Administered 2023-06-24: 12.5 ug via INTRAVENOUS
  Filled 2023-06-24: qty 1

## 2023-06-24 NOTE — Progress Notes (Signed)
PROGRESS NOTE    Jimmy Serrano  ZOX:[id number] DOB: 1981-09-27 DOA: 06/23/2023 PCP: Rick Duff, PA-C   Brief Narrative:  Jimmy Serrano is a 42 y.o. male with medical history significant of remote kidney stones.  Patient was in his usual state of health till about 3 days ago when he started having right flank pain radiating towards the groin with associated fever as high as 103* at home.  Assessment & Plan:   Principal Problem:   SIRS (systemic inflammatory response syndrome) (HCC) Active Problems:   Orchitis of right testicle   Orchitis   Sepsis secondary to orchitis of right testicle Testicular torsion ruled out Associated with sepsis: Fever, Tachycardia  Treat with ceftriaxone single dose and levofloxacin ongoing.  Blood cultures pending  DVT prophylaxis: SCDs Start: 06/23/23 2106 Code Status:   Code Status: Full Code Family Communication: At bedside  Status is: Inpt  Dispo: The patient is from: Home              Anticipated d/c is to: Home              Anticipated d/c date is: 48-72h              Patient currently NOT medically stable for discharge  Consultants:  None  Procedures:  None  Antimicrobials:  Levofloxacin   Subjective: No acute events overnight, pain ongoing but improved. Swelling minimally improved.  Objective: Vitals:   06/23/23 2005 06/23/23 2005 06/24/23 0008 06/24/23 0407  BP: 135/82 135/82 120/75 123/83  Pulse: 99 99 87 78  Resp: 16 16 16 17   Temp: 99.8 F (37.7 C) 99.8 F (37.7 C) 98.2 F (36.8 C) 97.6 F (36.4 C)  TempSrc: Oral Oral Oral Oral  SpO2: 97% 97% 97% 97%  Weight:      Height:       No intake or output data in the 24 hours ending 06/24/23 0807 Filed Weights   06/23/23 1154  Weight: 102.5 kg    Examination:  General exam: Appears calm and comfortable  Respiratory system: Clear to auscultation. Respiratory effort normal. Cardiovascular system: S1 & S2 heard, RRR. No JVD, murmurs, rubs, gallops or  clicks. No pedal edema. Gastrointestinal system: Abdomen is nondistended, soft and nontender. No organomegaly or masses felt. Normal bowel sounds heard. GU: Noted scrotal edema/erythema Central nervous system: Alert and oriented. No focal neurological deficits. Extremities: Symmetric 5 x 5 power. Skin: No rashes, lesions or ulcers   Data Reviewed: I have personally reviewed following labs and imaging studies  CBC: Recent Labs  Lab 06/23/23 1156 06/23/23 2156 06/24/23 0553  WBC 13.5* 10.3 10.5  HGB 16.4 14.9 14.8  HCT 46.2 43.4 44.0  MCV 86.4 88.9 89.6  PLT 194 150 137*   Basic Metabolic Panel: Recent Labs  Lab 06/23/23 1156 06/23/23 2156  NA 134*  --   K 3.8  --   CL 102  --   CO2 23  --   GLUCOSE 94  --   BUN 14  --   CREATININE 1.15 1.09  CALCIUM 9.7  --    GFR: Estimated Creatinine Clearance: 107.2 mL/min (by C-G formula based on SCr of 1.09 mg/dL). Liver Function Tests: Recent Labs  Lab 06/23/23 1156  AST 28  ALT 25  ALKPHOS 58  BILITOT 1.0  PROT 8.1  ALBUMIN 4.8   Recent Labs  Lab 06/23/23 1156  LIPASE 34   No results for input(s): "AMMONIA" in the last 168 hours. Coagulation Profile:  Recent Labs  Lab 06/23/23 1733 06/24/23 0553  INR 1.2 1.3*   Cardiac Enzymes: No results for input(s): "CKTOTAL", "CKMB", "CKMBINDEX", "TROPONINI" in the last 168 hours. BNP (last 3 results) No results for input(s): "PROBNP" in the last 8760 hours. HbA1C: No results for input(s): "HGBA1C" in the last 72 hours. CBG: No results for input(s): "GLUCAP" in the last 168 hours. Lipid Profile: No results for input(s): "CHOL", "HDL", "LDLCALC", "TRIG", "CHOLHDL", "LDLDIRECT" in the last 72 hours. Thyroid Function Tests: No results for input(s): "TSH", "T4TOTAL", "FREET4", "T3FREE", "THYROIDAB" in the last 72 hours. Anemia Panel: No results for input(s): "VITAMINB12", "FOLATE", "FERRITIN", "TIBC", "IRON", "RETICCTPCT" in the last 72 hours. Sepsis Labs: Recent Labs   Lab 06/23/23 1733 06/23/23 2041  LATICACIDVEN 1.9 0.8    Recent Results (from the past 240 hour(s))  Blood Culture (routine x 2)     Status: None (Preliminary result)   Collection Time: 06/23/23  5:31 PM   Specimen: BLOOD RIGHT FOREARM  Result Value Ref Range Status   Specimen Description   Final    BLOOD RIGHT FOREARM Performed at [facility], [address], [place], [place] [address]    Special Requests   Final    BOTTLES DRAWN AEROBIC AND ANAEROBIC Blood Culture adequate volume Performed at [facility], [address], [place], [place] [address]    Culture PENDING  Incomplete   Report Status PENDING  Incomplete  Blood Culture (routine x 2)     Status: None (Preliminary result)   Collection Time: 06/23/23  5:33 PM   Specimen: BLOOD LEFT FOREARM  Result Value Ref Range Status   Specimen Description   Final    BLOOD LEFT FOREARM Performed at [facility], [address], [place], [place] [address]    Special Requests   Final    Blood Culture adequate volume BOTTLES DRAWN AEROBIC AND ANAEROBIC Performed at [facility], [address] [place], [place] [address]    Culture PENDING  Incomplete   Report Status PENDING  Incomplete         Radiology Studies: US SCROTUM W/DOPPLER  Result Date: 06/23/2023 CLINICAL DATA:  Swelling. EXAM: SCROTAL ULTRASOUND DOPPLER ULTRASOUND OF THE TESTICLES TECHNIQUE: Complete ultrasound examination of the testicles, epididymis, and other scrotal structures was performed. Color and spectral Doppler ultrasound were also utilized to evaluate blood flow to the testicles. COMPARISON:  06/23/2023. FINDINGS: Right testicle Measurements: 5.7 x 3.2 x 4.1 cm. No mass or microlithiasis visualized. The right testicle is hypervascular and slightly heterogeneous. Left testicle Measurements: 5.7 x 2.3 x 3.3 cm. No mass or microlithiasis visualized. Right epididymis: An epididymal head cyst is noted  measuring 3 x 2 x 3 mm. Hypoechoic region is noted in the epididymal head on the right measuring 1.2 x 0.8 x 1.0 cm. No abnormal vascularity is seen. Left epididymis:  Normal in size and appearance. Hydrocele: Small bilateral hydroceles, greater on the right than on the left. Varicocele:  None visualized. Pulsed Doppler interrogation of both testes demonstrates normal low resistance arterial and venous waveforms bilaterally. IMPRESSION: 1. No evidence of testicular torsion. 2. Heterogeneous hypervascular right testicle suggesting orchitis. 3. Small bilateral hydroceles, greater on the right than on the left. 4. Right epididymal head cyst. A hypoechoic region is noted in the epididymal head measuring 1.2 x 0.8 x 1.0 cm with indeterminate imaging characteristics, which be benign, less likely malignant. Short-term three-month follow-up is recommended to document resolution. Electronically Signed   By: Vernona Rieger  Ladona Ridgel M.D.   On: 06/23/2023 21:55   DG Chest Port 1 View  Result Date: 06/23/2023 CLINICAL DATA:  Questionable sepsis. EXAM: PORTABLE CHEST 1 VIEW COMPARISON:  Chest x-ray February 15, 2018. FINDINGS: The heart size and mediastinal contours are within normal limits. Both lungs are clear. No visible pleural effusions or pneumothorax. No acute osseous abnormality. Cholecystectomy clips. IMPRESSION: No active disease. Electronically Signed   By: Feliberto Harts M.D.   On: 06/23/2023 17:18   CT Renal Stone Study  Result Date: 06/23/2023 CLINICAL DATA:  Acute right flank pain. EXAM: CT ABDOMEN AND PELVIS WITHOUT CONTRAST TECHNIQUE: Multidetector CT imaging of the abdomen and pelvis was performed following the standard protocol without IV contrast. RADIATION DOSE REDUCTION: This exam was performed according to the departmental dose-optimization program which includes automated exposure control, adjustment of the mA and/or kV according to patient size and/or use of iterative reconstruction technique. COMPARISON:   Apr 08, 2011. FINDINGS: Lower chest: No acute abnormality. Hepatobiliary: Status post cholecystectomy. No biliary dilatation is noted. Probable hepatic steatosis. Pancreas: Unremarkable. No pancreatic ductal dilatation or surrounding inflammatory changes. Spleen: Normal in size without focal abnormality. Adrenals/Urinary Tract: Adrenal glands are unremarkable. Kidneys are normal, without renal calculi, focal lesion, or hydronephrosis. Bladder is unremarkable. Stomach/Bowel: Stomach is within normal limits. Appendix appears normal. No evidence of bowel wall thickening, distention, or inflammatory changes. Vascular/Lymphatic: No significant vascular findings are present. No enlarged abdominal or pelvic lymph nodes. Reproductive: Prostate is unremarkable. Other: No abdominal wall hernia or abnormality. No abdominopelvic ascites. Musculoskeletal: No acute or significant osseous findings. IMPRESSION: Probable hepatic steatosis. No other abnormality seen in the abdomen or pelvis. Electronically Signed   By: Lupita Raider M.D.   On: 06/23/2023 13:04     Scheduled Meds:  acetaminophen  650 mg Oral Q4H   celecoxib  200 mg Oral BID   enoxaparin (LOVENOX) injection  50 mg Subcutaneous Q24H   sodium chloride flush  3 mL Intravenous Q12H   Continuous Infusions:  sodium chloride 100 mL/hr at 06/23/23 2135   levofloxacin (LEVAQUIN) IV 500 mg (06/23/23 2236)     LOS: 1 day   Time spent:  Azucena Fallen, DO [facility]  If 7PM-7AM, please contact night-coverage [email or web address]  06/24/2023, 8:07 AM

## 2023-06-24 NOTE — Plan of Care (Signed)

## 2023-06-24 NOTE — Progress Notes (Signed)
   06/24/23 1146  TOC Brief Assessment  Insurance and Status Reviewed  Patient has primary care physician Yes  Home environment has been reviewed Home w/ spouse  Prior level of function: Independent  Prior/Current Home Services No current home services  Social Determinants of Health Reivew SDOH reviewed no interventions necessary  Readmission risk has been reviewed Yes  Transition of care needs no transition of care needs at this time

## 2023-06-25 DIAGNOSIS — A419 Sepsis, unspecified organism: Secondary | ICD-10-CM | POA: Insufficient documentation

## 2023-06-25 LAB — BASIC METABOLIC PANEL
Anion gap: 6 (ref 5–15)
BUN: 10 mg/dL (ref 6–20)
CO2: 22 mmol/L (ref 22–32)
Calcium: 7.9 mg/dL — ABNORMAL LOW (ref 8.9–10.3)
Chloride: 107 mmol/L (ref 98–111)
Creatinine, Ser: 0.89 mg/dL (ref 0.61–1.24)
GFR, Estimated: >60 mL/min (ref >60)
Glucose, Bld: 92 mg/dL (ref 70–99)
Potassium: 3.6 mmol/L (ref 3.5–5.1)
Sodium: 135 mmol/L (ref 135–145)

## 2023-06-25 LAB — CBC
HCT: 40.5 % (ref 39.0–52.0)
Hemoglobin: 13.7 g/dL (ref 13.0–17.0)
MCH: 30.6 pg (ref 26.0–34.0)
MCHC: 33.8 g/dL (ref 30.0–36.0)
MCV: 90.6 fL (ref 80.0–100.0)
Platelets: 145 10*3/uL — ABNORMAL LOW (ref 150–400)
RBC: 4.47 MIL/uL (ref 4.22–5.81)
RDW: 12.7 % (ref 11.5–15.5)
WBC: 5.9 10*3/uL (ref 4.0–10.5)
nRBC: 0 % (ref 0.0–0.2)

## 2023-06-25 LAB — CULTURE, BLOOD (ROUTINE X 2)
Culture: NO GROWTH
Culture: NO GROWTH
Special Requests: ADEQUATE
Special Requests: ADEQUATE

## 2023-06-25 MED ORDER — LEVOFLOXACIN 500 MG PO TABS: 500.0000 mg | ORAL_TABLET | Freq: Every day | ORAL | 0 refills | Status: AC

## 2023-06-25 MED ORDER — ONDANSETRON 4 MG PO TBDP: 4.0000 mg | ORAL_TABLET | Freq: Three times a day (TID) | ORAL | 0 refills | Status: DC | PRN

## 2023-06-25 NOTE — Plan of Care (Signed)

## 2023-06-25 NOTE — Discharge Summary (Signed)
Physician Discharge Summary  Jimmy Serrano JWJ:[id number] DOB: [birth year] DOA: 06/23/2023  PCP: Rick Duff, PA-C  Admit date: 06/23/2023 Discharge date: 06/25/2023 30 Day Unplanned Readmission Risk Score    Flowsheet Row ED to Hosp-Admission (Current) from 06/23/2023 in [facility]  30 Day Unplanned Readmission Risk Score (%) 7.11 Filed at 06/25/2023 0800       This score is the patient's risk of an unplanned readmission within 30 days of being discharged (0 -100%). The score is based on dignosis, age, lab data, medications, orders, and past utilization.   Low:  0-14.9   Medium: 15-21.9   High: 22-29.9   Extreme: 30 and above          Admitted From: Home Disposition: Home  Recommendations for Outpatient Follow-up:  Follow up with PCP in 1-2 weeks Please obtain BMP/CBC in one week Please follow up with your PCP on the following pending results: Unresulted Labs (From admission, onward)     Start     Ordered   06/30/23 0500  Creatinine, serum  (enoxaparin (LOVENOX)    CrCl >/= 30 ml/min)  Weekly,   R     Comments: while on enoxaparin therapy    06/23/23 2106   06/25/23 0500  CBC  Every 48 hours,   R      06/24/23 1142   06/25/23 0500  Basic metabolic panel  Every 48 hours,   R      06/24/23 1142              Home Health: None Equipment/Devices: None  Discharge Condition: Stable CODE STATUS: Full code Diet recommendation: Regular  Subjective: Seen and examined.  Feels better.  Scrotal pain is improving and currently only 4 out of 10, patient feels comfortable going home.  Brief/Interim Summary: Jimmy Serrano is a 42 y.o. male with medical history significant of remote kidney stones.  Patient was in his usual state of health till about 3 days ago when he started having right flank pain radiating towards the groin with associated fever as high as 103* at home.  He was diagnosed with sepsis secondary to orchitis of the  right testicle, testicular torsion was ruled out.  Patient received 1 dose of Rocephin and started on Levaquin.  His sepsis resolved.  Pain improved.  Patient reported being monogamous.  GC chlamydia is pending.  Since he is improved, he is being discharged, he received 2 doses of Levaquin, he is being discharged on 8 more doses to complete 10 days.  Follow-up with PCP.   Discharge Diagnoses:  Active Problems:   Orchitis of right testicle   Orchitis   Sepsis [facility])    Discharge Instructions   Allergies as of 06/25/2023   No Known Allergies      Medication List     TAKE these medications    ibuprofen 200 MG tablet Commonly known as: ADVIL Take 600 mg by mouth as needed for headache.   levofloxacin 500 MG tablet Commonly known as: Levaquin Take 1 tablet (500 mg total) by mouth daily for 8 days.        Follow-up Information     Rick Duff, PA-C Follow up in 1 week(s).   Specialty: Physician Assistant Contact information: [address] [place] [address] [phone]                No Known Allergies  Consultations: None   Procedures/Studies: US SCROTUM W/DOPPLER  Result Date: 06/23/2023 CLINICAL DATA:  Swelling. EXAM: SCROTAL ULTRASOUND DOPPLER ULTRASOUND OF THE TESTICLES TECHNIQUE: Complete ultrasound examination of the testicles, epididymis, and other scrotal structures was performed. Color and spectral Doppler ultrasound were also utilized to evaluate blood flow to the testicles. COMPARISON:  06/23/2023. FINDINGS: Right testicle Measurements: 5.7 x 3.2 x 4.1 cm. No mass or microlithiasis visualized. The right testicle is hypervascular and slightly heterogeneous. Left testicle Measurements: 5.7 x 2.3 x 3.3 cm. No mass or microlithiasis visualized. Right epididymis: An epididymal head cyst is noted measuring 3 x 2 x 3 mm. Hypoechoic region is noted in the epididymal head on the right measuring 1.2 x 0.8 x 1.0 cm. No abnormal vascularity is  seen. Left epididymis:  Normal in size and appearance. Hydrocele: Small bilateral hydroceles, greater on the right than on the left. Varicocele:  None visualized. Pulsed Doppler interrogation of both testes demonstrates normal low resistance arterial and venous waveforms bilaterally. IMPRESSION: 1. No evidence of testicular torsion. 2. Heterogeneous hypervascular right testicle suggesting orchitis. 3. Small bilateral hydroceles, greater on the right than on the left. 4. Right epididymal head cyst. A hypoechoic region is noted in the epididymal head measuring 1.2 x 0.8 x 1.0 cm with indeterminate imaging characteristics, which be benign, less likely malignant. Short-term three-month follow-up is recommended to document resolution. Electronically Signed   By: Thornell Sartorius M.D.   On: 06/23/2023 21:55   DG Chest Port 1 View  Result Date: 06/23/2023 CLINICAL DATA:  Questionable sepsis. EXAM: PORTABLE CHEST 1 VIEW COMPARISON:  Chest x-ray February 15, 2018. FINDINGS: The heart size and mediastinal contours are within normal limits. Both lungs are clear. No visible pleural effusions or pneumothorax. No acute osseous abnormality. Cholecystectomy clips. IMPRESSION: No active disease. Electronically Signed   By: Feliberto Harts M.D.   On: 06/23/2023 17:18   CT Renal Stone Study  Result Date: 06/23/2023 CLINICAL DATA:  Acute right flank pain. EXAM: CT ABDOMEN AND PELVIS WITHOUT CONTRAST TECHNIQUE: Multidetector CT imaging of the abdomen and pelvis was performed following the standard protocol without IV contrast. RADIATION DOSE REDUCTION: This exam was performed according to the departmental dose-optimization program which includes automated exposure control, adjustment of the mA and/or kV according to patient size and/or use of iterative reconstruction technique. COMPARISON:  Apr 08, 2011. FINDINGS: Lower chest: No acute abnormality. Hepatobiliary: Status post cholecystectomy. No biliary dilatation is noted. Probable  hepatic steatosis. Pancreas: Unremarkable. No pancreatic ductal dilatation or surrounding inflammatory changes. Spleen: Normal in size without focal abnormality. Adrenals/Urinary Tract: Adrenal glands are unremarkable. Kidneys are normal, without renal calculi, focal lesion, or hydronephrosis. Bladder is unremarkable. Stomach/Bowel: Stomach is within normal limits. Appendix appears normal. No evidence of bowel wall thickening, distention, or inflammatory changes. Vascular/Lymphatic: No significant vascular findings are present. No enlarged abdominal or pelvic lymph nodes. Reproductive: Prostate is unremarkable. Other: No abdominal wall hernia or abnormality. No abdominopelvic ascites. Musculoskeletal: No acute or significant osseous findings. IMPRESSION: Probable hepatic steatosis. No other abnormality seen in the abdomen or pelvis. Electronically Signed   By: Lupita Raider M.D.   On: 06/23/2023 13:04     Discharge Exam: Vitals:   06/24/23 1957 06/25/23 0447  BP: 126/84 121/84  Pulse: 87 78  Resp: 18 18  Temp: 98.9 F (37.2 C) (!) 97.5 F (36.4 C)  SpO2: 98% 96%   Vitals:   06/24/23 0800 06/24/23 1400 06/24/23 1957 06/25/23 0447  BP: 115/77 115/79 126/84 121/84  Pulse: 74 84 87 78  Resp: 16  17 18 18   Temp: (!) 97.4 F (36.3 C) 98.3 F (36.8 C) 98.9 F (37.2 C) (!) 97.5 F (36.4 C)  TempSrc: Oral Oral  Oral  SpO2: 98% 97% 98% 96%  Weight:      Height:        General: Pt is alert, awake, not in acute distress Cardiovascular: RRR, S1/S2 +, no rubs, no gallops Respiratory: CTA bilaterally, no wheezing, no rhonchi Abdominal: Soft, NT, ND, bowel sounds + Extremities: no edema, no cyanosis    The results of significant diagnostics from this hospitalization (including imaging, microbiology, ancillary and laboratory) are listed below for reference.     Microbiology: Recent Results (from the past 240 hour(s))  Blood Culture (routine x 2)     Status: None (Preliminary result)    Collection Time: 06/23/23  5:31 PM   Specimen: BLOOD RIGHT FOREARM  Result Value Ref Range Status   Specimen Description   Final    BLOOD RIGHT FOREARM Performed at [facility], [address], [place], [place] [address]    Special Requests   Final    BOTTLES DRAWN AEROBIC AND ANAEROBIC Blood Culture adequate volume Performed at [facility], [address], [place], [place] [address]    Culture   Final    NO GROWTH 2 DAYS Performed at [facility], [address] [place], [place] [address]    Report Status PENDING  Incomplete  Blood Culture (routine x 2)     Status: None (Preliminary result)   Collection Time: 06/23/23  5:33 PM   Specimen: BLOOD LEFT FOREARM  Result Value Ref Range Status   Specimen Description   Final    BLOOD LEFT FOREARM Performed at [facility], [address] [place], [place] [address]    Special Requests   Final    Blood Culture adequate volume BOTTLES DRAWN AEROBIC AND ANAEROBIC Performed at [facility], [address] [place], [place] [address]    Culture   Final    NO GROWTH 2 DAYS Performed at [facility], [address] [place], [place] [address]    Report Status PENDING  Incomplete     Labs: BNP (last 3 results) No results for input(s): "BNP" in the last 8760 hours. Basic Metabolic Panel: Recent Labs  Lab 06/23/23 1156 06/23/23 2156 06/24/23 0553 06/25/23 0542  NA 134*  --  134* 135  K 3.8  --  4.0 3.6  CL 102  --  107 107  CO2 23  --  20* 22  GLUCOSE 94  --  101* 92  BUN 14  --  11 10  CREATININE 1.15 1.09 0.93 0.89  CALCIUM 9.7  --  7.9* 7.9*   Liver Function Tests: Recent Labs  Lab 06/23/23 1156  AST 28  ALT 25  ALKPHOS 58  BILITOT 1.0  PROT 8.1  ALBUMIN 4.8   Recent Labs  Lab 06/23/23 1156  LIPASE 34   No results for input(s): "AMMONIA" in the last 168 hours. CBC: Recent Labs  Lab 06/23/23 1156 06/23/23 2156 06/24/23 0553 06/25/23 0542   WBC 13.5* 10.3 10.5 5.9  HGB 16.4 14.9 14.8 13.7  HCT 46.2 43.4 44.0 40.5  MCV 86.4 88.9 89.6 90.6  PLT 194 150 137* 145*   Cardiac Enzymes: No results for input(s): "CKTOTAL", "CKMB", "CKMBINDEX", "TROPONINI" in the last 168 hours. BNP: Invalid input(s): "POCBNP" CBG: No results for input(s): "GLUCAP" in the last 168 hours. D-Dimer  No results for input(s): "DDIMER" in the last 72 hours. Hgb A1c No results for input(s): "HGBA1C" in the last 72 hours. Lipid Profile No results for input(s): "CHOL", "HDL", "LDLCALC", "TRIG", "CHOLHDL", "LDLDIRECT" in the last 72 hours. Thyroid function studies No results for input(s): "TSH", "T4TOTAL", "T3FREE", "THYROIDAB" in the last 72 hours.  Invalid input(s): "FREET3" Anemia work up No results for input(s): "VITAMINB12", "FOLATE", "FERRITIN", "TIBC", "IRON", "RETICCTPCT" in the last 72 hours. Urinalysis    Component Value Date/Time   COLORURINE YELLOW 06/23/2023 1419   APPEARANCEUR CLEAR 06/23/2023 1419   LABSPEC 1.022 06/23/2023 1419   PHURINE 6.0 06/23/2023 1419   GLUCOSEU NEGATIVE 06/23/2023 1419   HGBUR SMALL (A) 06/23/2023 1419   BILIRUBINUR NEGATIVE 06/23/2023 1419   KETONESUR NEGATIVE 06/23/2023 1419   PROTEINUR NEGATIVE 06/23/2023 1419   UROBILINOGEN 0.2 04/08/2011 1107   NITRITE NEGATIVE 06/23/2023 1419   LEUKOCYTESUR NEGATIVE 06/23/2023 1419   Sepsis Labs Recent Labs  Lab 06/23/23 1156 06/23/23 2156 06/24/23 0553 06/25/23 0542  WBC 13.5* 10.3 10.5 5.9   Microbiology Recent Results (from the past 240 hour(s))  Blood Culture (routine x 2)     Status: None (Preliminary result)   Collection Time: 06/23/23  5:31 PM   Specimen: BLOOD RIGHT FOREARM  Result Value Ref Range Status   Specimen Description   Final    BLOOD RIGHT FOREARM Performed at [facility], [address] [place], [place] [address]    Special Requests   Final    BOTTLES DRAWN AEROBIC AND ANAEROBIC Blood Culture adequate volume Performed at  [facility], [address] [place], [place] [address]    Culture   Final    NO GROWTH 2 DAYS Performed at [facility], [address], [place], [place] [address]    Report Status PENDING  Incomplete  Blood Culture (routine x 2)     Status: None (Preliminary result)   Collection Time: 06/23/23  5:33 PM   Specimen: BLOOD LEFT FOREARM  Result Value Ref Range Status   Specimen Description   Final    BLOOD LEFT FOREARM Performed at [facility], [address] [place], [place] [address]    Special Requests   Final    Blood Culture adequate volume BOTTLES DRAWN AEROBIC AND ANAEROBIC Performed at [facility], [address], [place], [place] [address]    Culture   Final    NO GROWTH 2 DAYS Performed at [facility], [address], [place], [place] [address]    Report Status PENDING  Incomplete    FURTHER DISCHARGE INSTRUCTIONS:   Get Medicines reviewed and adjusted: Please take all your medications with you for your next visit with your Primary MD   Laboratory/radiological data: Please request your Primary MD to go over all hospital tests and procedure/radiological results at the follow up, please ask your Primary MD to get all Hospital records sent to his/her office.   In some cases, they will be blood work, cultures and biopsy results pending at the time of your discharge. Please request that your primary care M.D. goes through all the records of your hospital data and follows up on these results.   Also Note the following: If you experience worsening of your admission symptoms, develop shortness of breath, life threatening emergency, suicidal or homicidal thoughts you must seek medical attention immediately by calling 911 or calling your MD immediately  if symptoms less severe.   You must read  complete instructions/literature along with all the possible adverse reactions/side effects for all the Medicines you take and  that have been prescribed to you. Take any new Medicines after you have completely understood and accpet all the possible adverse reactions/side effects.    Do not drive when taking Pain medications or sleeping medications (Benzodaizepines)   Do not take more than prescribed Pain, Sleep and Anxiety Medications. It is not advisable to combine anxiety,sleep and pain medications without talking with your primary care practitioner   Special Instructions: If you have smoked or chewed Tobacco  in the last 2 yrs please stop smoking, stop any regular Alcohol  and or any Recreational drug use.   Wear Seat belts while driving.   Please note: You were cared for by a hospitalist during your hospital stay. Once you are discharged, your primary care physician will handle any further medical issues. Please note that NO REFILLS for any discharge medications will be authorized once you are discharged, as it is imperative that you return to your primary care physician (or establish a relationship with a primary care physician if you do not have one) for your post hospital discharge needs so that they can reassess your need for medications and monitor your lab values  Time coordinating discharge: Over 30 minutes  SIGNED:   Hughie Closs, MD  [facility] 06/25/2023, 10:15 AM *Please note that this is a verbal dictation therefore any spelling or grammatical errors are due to the "Dragon [employer] One" system interpretation. If 7PM-7AM, please contact night-coverage [email or web address]

## 2023-06-28 LAB — CULTURE, BLOOD (ROUTINE X 2)

## 2023-07-07 ENCOUNTER — Ambulatory Visit
Admission: EM | Admit: 2023-07-07 | Discharge: 2023-07-07 | Disposition: A | Discharge status: Home / Self Care | Payer: Commercial Managed Care - PPO | Attending: Family Medicine | Admitting: Family Medicine

## 2023-07-07 ENCOUNTER — Ambulatory Visit: Payer: Self-pay

## 2023-07-07 DIAGNOSIS — N452 Orchitis: Secondary | ICD-10-CM | POA: Diagnosis not present

## 2023-07-07 DIAGNOSIS — Z8619 Personal history of other infectious and parasitic diseases: Secondary | ICD-10-CM | POA: Diagnosis not present

## 2023-07-07 DIAGNOSIS — R339 Retention of urine, unspecified: Secondary | ICD-10-CM

## 2023-07-07 LAB — POCT URINALYSIS DIP (MANUAL ENTRY)
Bilirubin, UA: NEGATIVE
Blood, UA: NEGATIVE
Glucose, UA: NEGATIVE mg/dL
Ketones, POC UA: NEGATIVE mg/dL
Leukocytes, UA: NEGATIVE
Nitrite, UA: NEGATIVE
Protein Ur, POC: NEGATIVE mg/dL
Spec Grav, UA: 1.015 (ref 1.010–1.025)
Urobilinogen, UA: 0.2 E.U./dL
pH, UA: 6 (ref 5.0–8.0)

## 2023-07-07 MED ORDER — SULFAMETHOXAZOLE-TRIMETHOPRIM 800-160 MG PO TABS: 1.0000 | ORAL_TABLET | Freq: Two times a day (BID) | ORAL | 0 refills | Status: AC

## 2023-07-07 NOTE — ED Provider Notes (Signed)
[facility]    CSN: [id number] Arrival date & time: 07/07/23  1311      History   Chief Complaint No chief complaint on file.   HPI Jimmy Serrano is a 42 y.o. male.   Presenting today with 1 day history of urinary hesitancy, low back aching, lower abdominal pain, tenderness in the testicle and slight swelling.  Was recently hospitalized for sepsis secondary to orchitis of the right testicle, placed on Levaquin which she finished 2 days ago.  1 day after completion of the antibiotics symptoms started returning.  Denies known fever, chills, sweats, nausea, vomiting.  Not tried anything over-the-counter for symptoms currently.  Has a follow-up next week with his PCP regarding this matter.    Past Medical History:  Diagnosis Date   Kidney stones     Patient Active Problem List   Diagnosis Date Noted   Sepsis (HCC) 06/25/2023   SIRS (systemic inflammatory response syndrome) (HCC) 06/23/2023   Orchitis of right testicle 06/23/2023   Orchitis 06/23/2023    Past Surgical History:  Procedure Laterality Date   CHOLECYSTECTOMY         Home Medications    Prior to Admission medications   Medication Sig Start Date End Date Taking? Authorizing Provider  sulfamethoxazole-trimethoprim (BACTRIM DS) 800-160 MG tablet Take 1 tablet by mouth 2 (two) times daily for 14 days. 07/07/23 07/21/23 Yes Particia Nearing, PA-C  ibuprofen (ADVIL) 200 MG tablet Take 600 mg by mouth as needed for headache.    [provider]  ondansetron (ZOFRAN-ODT) 4 MG disintegrating tablet Take 1 tablet (4 mg total) by mouth every 8 (eight) hours as needed for nausea or vomiting. 06/25/23   Hughie Closs, MD    Family History History reviewed. No pertinent family history.  Social History Social History   Tobacco Use   Smoking status: Never   Smokeless tobacco: Never  Substance Use Topics   Alcohol use: No   Drug use: No     Allergies   Patient has no known  allergies.   Review of Systems Review of Systems PER HPI  Physical Exam Triage Vital Signs ED Triage Vitals  Encounter Vitals Group     BP 07/07/23 1334 137/88     Systolic BP Percentile --      Diastolic BP Percentile --      Pulse Rate 07/07/23 1334 85     Resp 07/07/23 1334 18     Temp 07/07/23 1334 98 F (36.7 C)     Temp Source 07/07/23 1334 Oral     SpO2 07/07/23 1334 95 %     Weight --      Height --      Head Circumference --      Peak Flow --      Pain Score 07/07/23 1338 5     Pain Loc --      Pain Education --      Exclude from Growth Chart --    No data found.  Updated Vital Signs BP 137/88 (BP Location: Right Arm)   Pulse 85   Temp 98 F (36.7 C) (Oral)   Resp 18   SpO2 95%   Visual Acuity Right Eye Distance:   Left Eye Distance:   Bilateral Distance:    Right Eye Near:   Left Eye Near:    Bilateral Near:     Physical Exam Vitals and nursing note reviewed.  Constitutional:      Appearance: Normal  appearance.  HENT:     Head: Atraumatic.  Eyes:     Extraocular Movements: Extraocular movements intact.     Conjunctiva/sclera: Conjunctivae normal.  Cardiovascular:     Rate and Rhythm: Normal rate and regular rhythm.  Pulmonary:     Effort: Pulmonary effort is normal.     Breath sounds: Normal breath sounds.  Abdominal:     General: Bowel sounds are normal. There is no distension.     Palpations: Abdomen is soft.     Tenderness: There is abdominal tenderness. There is no right CVA tenderness, left CVA tenderness or guarding.     Comments: Mild lower abdominal tenderness to palpation without distention or guarding  Genitourinary:    Comments: GU exam deferred with shared decision making Musculoskeletal:        General: Normal range of motion.     Cervical back: Normal range of motion and neck supple.  Skin:    General: Skin is warm and dry.  Neurological:     General: No focal deficit present.     Mental Status: He is oriented to  person, place, and time.  Psychiatric:        Mood and Affect: Mood normal.        Thought Content: Thought content normal.        Judgment: Judgment normal.      UC Treatments / Results  Labs (all labs ordered are listed, but only abnormal results are displayed) Labs Reviewed  COMPREHENSIVE METABOLIC PANEL  CBC WITH DIFFERENTIAL/PLATELET  POCT URINALYSIS DIP (MANUAL ENTRY)    EKG   Radiology No results found.  Procedures Procedures (including critical care time)  Medications Ordered in UC Medications - No data to display  Initial Impression / Assessment and Plan / UC Course  I have reviewed the triage vital signs and the nursing notes.  Pertinent labs & imaging results that were available during my care of the patient were reviewed by me and considered in my medical decision making (see chart for details).     Vital signs and exam overall reassuring and urinalysis within normal limits but given recent hospitalization and return of symptoms post antibiotics, will switch to Bactrim for 2 weeks and await PCP follow-up.  Urology information given in case needed in the meantime.  Return for worsening symptoms. Final Clinical Impressions(s) / UC Diagnoses   Final diagnoses:  History of sepsis  Orchitis  Urinary retention   Discharge Instructions   None    ED Prescriptions     Medication Sig Dispense Auth. Provider   sulfamethoxazole-trimethoprim (BACTRIM DS) 800-160 MG tablet Take 1 tablet by mouth 2 (two) times daily for 14 days. 28 tablet Particia Nearing, [place]      PDMP not reviewed this encounter.   Particia Nearing, [place] 07/07/23 1559

## 2023-07-07 NOTE — ED Triage Notes (Signed)
Pt reports he has just completed his antibiotic Sunday and is having trouble urinating (when he feels like he has to urinate nothing comes out) low back pain low abdominal pain, and "tender testicles" x 1 day.

## 2024-06-24 ENCOUNTER — Encounter (HOSPITAL_BASED_OUTPATIENT_CLINIC_OR_DEPARTMENT_OTHER): Payer: Self-pay | Admitting: Emergency Medicine

## 2024-06-24 ENCOUNTER — Emergency Department (HOSPITAL_BASED_OUTPATIENT_CLINIC_OR_DEPARTMENT_OTHER)

## 2024-06-24 ENCOUNTER — Emergency Department (HOSPITAL_BASED_OUTPATIENT_CLINIC_OR_DEPARTMENT_OTHER)
Admission: EM | Admit: 2024-06-24 | Discharge: 2024-06-24 | Disposition: A | Discharge status: Home / Self Care | Attending: Emergency Medicine | Admitting: Emergency Medicine

## 2024-06-24 DIAGNOSIS — R079 Chest pain, unspecified: Secondary | ICD-10-CM | POA: Diagnosis present

## 2024-06-24 DIAGNOSIS — M79602 Pain in left arm: Secondary | ICD-10-CM | POA: Insufficient documentation

## 2024-06-24 DIAGNOSIS — I10 Essential (primary) hypertension: Secondary | ICD-10-CM | POA: Insufficient documentation

## 2024-06-24 LAB — CBC
HCT: 44 % (ref 39.0–52.0)
Hemoglobin: 15.7 g/dL (ref 13.0–17.0)
MCH: 31.2 pg (ref 26.0–34.0)
MCHC: 35.7 g/dL (ref 30.0–36.0)
MCV: 87.5 fL (ref 80.0–100.0)
Platelets: 221 K/uL (ref 150–400)
RBC: 5.03 MIL/uL (ref 4.22–5.81)
RDW: 12.9 % (ref 11.5–15.5)
WBC: 7.6 K/uL (ref 4.0–10.5)
nRBC: 0 % (ref 0.0–0.2)

## 2024-06-24 LAB — HEPATIC FUNCTION PANEL
ALT: 25 U/L (ref 0–44)
AST: 23 U/L (ref 15–41)
Albumin: 4.3 g/dL (ref 3.5–5.0)
Alkaline Phosphatase: 77 U/L (ref 38–126)
Bilirubin, Direct: 0.2 mg/dL (ref 0.0–0.2)
Indirect Bilirubin: 0.4 mg/dL (ref 0.3–0.9)
Total Bilirubin: 0.7 mg/dL (ref 0.0–1.2)
Total Protein: 7.5 g/dL (ref 6.5–8.1)

## 2024-06-24 LAB — BASIC METABOLIC PANEL WITH GFR
Anion gap: 11 (ref 5–15)
BUN: 13 mg/dL (ref 6–20)
CO2: 24 mmol/L (ref 22–32)
Calcium: 9.4 mg/dL (ref 8.9–10.3)
Chloride: 105 mmol/L (ref 98–111)
Creatinine, Ser: 1.21 mg/dL (ref 0.61–1.24)
GFR, Estimated: >60 mL/min (ref >60)
Glucose, Bld: 99 mg/dL (ref 70–99)
Potassium: 4.1 mmol/L (ref 3.5–5.1)
Sodium: 139 mmol/L (ref 135–145)

## 2024-06-24 LAB — TROPONIN T, HIGH SENSITIVITY
Troponin T High Sensitivity: <15 ng/L (ref <19)
Troponin T High Sensitivity: <15 ng/L (ref <19)

## 2024-06-24 LAB — D-DIMER, QUANTITATIVE: D-Dimer, Quant: <0.27 ug{FEU}/mL (ref 0.00–0.50)

## 2024-06-24 MED ORDER — IOHEXOL 350 MG/ML SOLN
100.0000 mL | Freq: Once | INTRAVENOUS | Status: AC | PRN
  Administered 2024-06-24: 100 mL via INTRAVENOUS

## 2024-06-24 MED ORDER — ONDANSETRON HCL 4 MG/2ML IJ SOLN
4.0000 mg | Freq: Once | INTRAMUSCULAR | Status: AC
  Administered 2024-06-24: 4 mg via INTRAVENOUS
  Filled 2024-06-24: qty 2

## 2024-06-24 NOTE — ED Provider Notes (Signed)
 [place] [facility] AT [facility] Provider Note   CSN: [id number] Arrival date & time: 06/24/24  9147     Patient presents with: Chest Pain and Numbness   Jimmy Serrano is a 43 y.o. male.   43 y.o male with a PMH of HTN (not on medication) presents to the ED with a chief complaint of chest pain, left arm numbness and shortness of breath. Symptoms began yesterday when he was driving the car, described as grasping the steering wheel and my arm going numb. He states getting home and resting. This morning woke up with left arm tingling and numbness along with substernal chest pressure and squeeze feeling. He reports moving around makes him feel winded, attempting to go down the stairs and feeling like I cannot catch my breath. No alleviating factors. Reports also this morning, not being able to see his phone, noted the screen to be blurry. No prior hx of CAD, no tobacco use, no prior hx of blood clots, no vomiting or abdominal pain.    The history is provided by the patient.  Chest Pain Pain location:  L chest Pain quality: throbbing   Pain radiates to:  L arm Pain severity:  Moderate Onset quality:  Gradual Duration:  10 hours Timing:  Constant Progression:  Unchanged Chronicity:  New Relieved by:  Nothing Worsened by:  Movement and exertion Ineffective treatments:  None tried Associated symptoms: nausea and shortness of breath   Associated symptoms: no abdominal pain and no fever   Risk factors: hypertension and male sex   Risk factors: no coronary artery disease, no diabetes mellitus, no prior DVT/PE and no smoking        Prior to Admission medications   Medication Sig Start Date End Date Taking? Authorizing Provider  ibuprofen  (ADVIL ) 200 MG tablet Take 600 mg by mouth as needed for headache.    [provider]  ondansetron  (ZOFRAN -ODT) 4 MG disintegrating tablet Take 1 tablet (4 mg total) by mouth every 8 (eight) hours as needed for  nausea or vomiting. 06/25/23   Vernon Ranks, MD    Allergies: Patient has no known allergies.    Review of Systems  Constitutional:  Negative for fever.  Respiratory:  Positive for shortness of breath.   Cardiovascular:  Positive for chest pain.  Gastrointestinal:  Positive for nausea. Negative for abdominal pain.  All other systems reviewed and are negative.   Updated Vital Signs BP (!) 134/100   Pulse 72   Temp 97.6 F (36.4 C) (Oral)   Resp 18   Wt 103.4 kg   SpO2 95%   BMI 30.92 kg/m   Physical Exam Vitals and nursing note reviewed.     (all labs ordered are listed, but only abnormal results are displayed) Labs Reviewed  BASIC METABOLIC PANEL WITH GFR  CBC  HEPATIC FUNCTION PANEL  D-DIMER, QUANTITATIVE  TROPONIN T, HIGH SENSITIVITY  TROPONIN T, HIGH SENSITIVITY    EKG: EKG Interpretation Date/Time:  Sunday June 24 2024 08:56:13 EDT Ventricular Rate:  62 PR Interval:  150 QRS Duration:  91 QT Interval:  387 QTC Calculation: 393 R Axis:   32  Text Interpretation: Sinus rhythm Confirmed by Patsey Lot [phone]) on 06/24/2024 10:28:57 AM  Radiology: CT Angio Chest/Abd/Pel for Dissection W and/or Wo Contrast Result Date: 06/24/2024 CLINICAL DATA:  Left arm weakness, chest pain, nausea EXAM: CT ANGIOGRAPHY CHEST, ABDOMEN AND PELVIS TECHNIQUE: Non-contrast CT of the chest was initially obtained. Multidetector CT imaging through the chest,  abdomen and pelvis was performed using the standard protocol during bolus administration of intravenous contrast. Multiplanar reconstructed images and MIPs were obtained and reviewed to evaluate the vascular anatomy. RADIATION DOSE REDUCTION: This exam was performed according to the departmental dose-optimization program which includes automated exposure control, adjustment of the mA and/or kV according to patient size and/or use of iterative reconstruction technique. CONTRAST:  OMNIPAQUE  IOHEXOL  350 MG/ML SOLN COMPARISON:   CT 06/23/2023 and previous FINDINGS: CTA CHEST FINDINGS Cardiovascular: Heart size normal. No pericardial effusion. Satisfactory opacification of pulmonary arteries noted, and there is no evidence of pulmonary emboli. Adequate contrast opacification of the thoracic aorta with no evidence of dissection, aneurysm, or stenosis. There is bovine variant brachiocephalic arch anatomy without proximal stenosis. Mediastinum/Nodes: No mass, hematoma, or adenopathy. Lungs/Pleura: No pleural effusion. No pneumothorax. Minimal dependent atelectasis in the lower lobes. Lungs otherwise clear. Musculoskeletal: No chest wall abnormality. No acute or significant osseous findings. Review of the MIP images confirms the above findings. CTA ABDOMEN AND PELVIS FINDINGS VASCULAR Aorta: Normal caliber aorta without aneurysm, dissection, vasculitis or significant stenosis. Celiac: Patent without evidence of aneurysm, dissection, vasculitis or significant stenosis. SMA: Patent without evidence of aneurysm, dissection, vasculitis or significant stenosis. Renals: Both renal arteries are patent without evidence of aneurysm, dissection, vasculitis, fibromuscular dysplasia or significant stenosis. IMA: Patent without evidence of aneurysm, dissection, vasculitis or significant stenosis. Inflow: Patent without evidence of aneurysm, dissection, vasculitis or significant stenosis. Veins: No obvious venous abnormality within the limitations of this arterial phase study. Review of the MIP images confirms the above findings. NON-VASCULAR Hepatobiliary: No focal liver abnormality is seen. Status post cholecystectomy. No biliary dilatation. Pancreas: Unremarkable. No pancreatic ductal dilatation or surrounding inflammatory changes. Spleen: Normal in size without focal abnormality. Adrenals/Urinary Tract: Adrenal glands are unremarkable. Kidneys are normal, without renal calculi, focal lesion, or hydronephrosis. Bladder is unremarkable. Stomach/Bowel:  Stomach is within normal limits. Appendix appears normal. No evidence of bowel wall thickening, distention, or inflammatory changes. Lymphatic: No abdominal or pelvic adenopathy. Reproductive: Mild prostate enlargement with central coarse calcifications. Other: No ascites.  No free air. Musculoskeletal: No acute or significant osseous findings. Review of the MIP images confirms the above findings. IMPRESSION: 1. Negative for acute PE or thoracic aortic dissection. 2. No acute findings. 3. Mild prostate enlargement. Electronically Signed   By: JONETTA Faes M.D.   On: 06/24/2024 10:23     Procedures   Medications Ordered in the ED  ondansetron  (ZOFRAN ) injection 4 mg (4 mg Intravenous Given 06/24/24 0928)  iohexol  (OMNIPAQUE ) 350 MG/ML injection 100 mL (100 mLs Intravenous Contrast Given 06/24/24 1002)                                    Medical Decision Making Amount and/or Complexity of Data Reviewed Labs: ordered. Radiology: ordered.  Risk Prescription drug management.   This patient presents to the ED for concern of chest pain, numbness, this involves a number of treatment options, and is a complaint that carries with it a high risk of complications and morbidity.  The differential diagnosis includes Dissection, ACS, PE versus atypical chest pain.   Co morbidities: Discussed in HPI   Brief History:  See HPI.   EMR reviewed including pt PMHx, past surgical history and past visits to ER.   See HPI for more details   Lab Tests:  I ordered and independently interpreted labs.  The pertinent results  include:    I personally reviewed all laboratory work and imaging. Metabolic panel without any acute abnormality specifically kidney function within normal limits and no significant electrolyte abnormalities. CBC without leukocytosis or significant anemia.  Imaging Studies:  CT Angio chest/abd/pelv: IMPRESSION:  1. Negative for acute PE or thoracic aortic dissection.  2. No acute  findings.  3. Mild prostate enlargement.   Cardiac Monitoring:  The patient was maintained on a cardiac monitor.  I personally viewed and interpreted the cardiac monitored which showed an underlying rhythm of: NSR EKG non-ischemic  Reevaluation:  After the interventions noted above I re-evaluated patient and found that they have :stayed the same  Social Determinants of Health:  The patient's social determinants of health were a factor in the care of this patient  Problem List / ED Course:  Patient presented to the ED with a chief complaint of left-sided chest pain, left hand numbness which occurred last night, felt that he was likely gripping the steering well too hard (I didn't know those roads). Today woke up with left arm tingling and numbness, states he tried to go up and down the stairs and felt very winded like he had to catch his breath. He also noted to be diaphoretic by his wife. No prior cardiac history. On arrival his vitlals are within normal limits although BP from right arm to left arm varies by only on the systolic. He moves all upper and lower extremities well, no focal weakness, neuro exam is benign.  Blood work here showed a CBC with no leukocytosis, he does not have any infectious signs, no fever, no cough to suspect pneumonia.  BMP with no electrolyte derangement, anion gap is normal.  Hepatic function is within normal limits, there is no pain to his abdomen no vomiting to suspect intra-abdominal etiology. Troponin x 2 has remained flat, EKG without any ST changes, normal sinus rhythm, low concern for ACS with no prior cardiac history, no family history of CAD. Strong suspicion for aortic dissection, based on his symptoms along with neurological component, CT angio chest and pelvis was ordered, this was read as normal on today's visit.  Patient received some Zofran  to help with nausea. Upon reevaluation patient does report no improvement in his symptoms, he is just  glad that nothing is wrong at this time . With tingling and numbness of the left arm also considered intracranial pathology such as stroke versus CVA, his exam is nonfocal, he is ambulatory with a steady gait.  No changes in speech, no changes in his gait.  Attempted to obtain a CT head, however due to patient receiving contrast this would not be an accurate study according to radiology.  I discussed this with patient as well, he is agreeable of bypassing the study at this time. I discussed these results with patient at length, I discussed the case with my attending Dr. Patsey, who also evaluated patient and feels that no emergent process at this time.  Patient is otherwise hemodynamically stable, we discussed all his results at length, I did instruct him to return to the emergency department if he feels that his symptoms have worsened.  He is agreeable to plan and treatment at this time.  Strict return precautions along with close follow-up with PCP.  Hemodynamically stable for discharge.  Dispostion:  After consideration of the diagnostic results and the patients response to treatment, I feel that the patent would benefit from lose follow up with PCP.     Pulses  paradox   Portions of this note were generated with Scientist, clinical (histocompatibility and immunogenetics). Dictation errors may occur despite best attempts at proofreading.       Final diagnoses:  Chest pain, unspecified type  Left arm pain    ED Discharge Orders     None          Maureen Broad, PA-C 06/24/24 1214    Patsey Lot, MD 06/25/24 1251

## 2024-06-24 NOTE — ED Triage Notes (Signed)
 States left arm felt weak while driving. States arm felt like it couldn't wake up. Endorses squeezing CP and nausea starting around the same time. LKN around 2030.

## 2024-06-24 NOTE — Discharge Instructions (Addendum)
 Your laboratories also are within normal limits today.  Please schedule an appointment with your primary care physician in the upcoming week for reevaluation.  If you experience any worsening symptoms please return to the emergency department.

## 2024-07-02 ENCOUNTER — Other Ambulatory Visit: Payer: Self-pay

## 2024-07-02 ENCOUNTER — Encounter (HOSPITAL_COMMUNITY): Payer: Self-pay

## 2024-07-02 ENCOUNTER — Inpatient Hospital Stay (HOSPITAL_COMMUNITY)
Admission: EM | Admit: 2024-07-02 | Discharge: 2024-07-05 | DRG: 694 | Disposition: A | Discharge status: Home / Self Care | Attending: Internal Medicine | Admitting: Internal Medicine

## 2024-07-02 ENCOUNTER — Emergency Department (HOSPITAL_COMMUNITY)

## 2024-07-02 DIAGNOSIS — N23 Unspecified renal colic: Principal | ICD-10-CM

## 2024-07-02 DIAGNOSIS — I10 Essential (primary) hypertension: Secondary | ICD-10-CM | POA: Diagnosis present

## 2024-07-02 DIAGNOSIS — N179 Acute kidney failure, unspecified: Secondary | ICD-10-CM | POA: Diagnosis not present

## 2024-07-02 DIAGNOSIS — N200 Calculus of kidney: Secondary | ICD-10-CM

## 2024-07-02 DIAGNOSIS — D72829 Elevated white blood cell count, unspecified: Secondary | ICD-10-CM | POA: Diagnosis present

## 2024-07-02 DIAGNOSIS — K760 Fatty (change of) liver, not elsewhere classified: Secondary | ICD-10-CM | POA: Diagnosis present

## 2024-07-02 DIAGNOSIS — I7300 Raynaud's syndrome without gangrene: Secondary | ICD-10-CM | POA: Diagnosis present

## 2024-07-02 DIAGNOSIS — N202 Calculus of kidney with calculus of ureter: Principal | ICD-10-CM | POA: Diagnosis present

## 2024-07-02 DIAGNOSIS — E785 Hyperlipidemia, unspecified: Secondary | ICD-10-CM | POA: Diagnosis present

## 2024-07-02 LAB — COMPREHENSIVE METABOLIC PANEL WITH GFR
ALT: 27 U/L (ref 0–44)
AST: 25 U/L (ref 15–41)
Albumin: 4.5 g/dL (ref 3.5–5.0)
Alkaline Phosphatase: 68 U/L (ref 38–126)
Anion gap: 10 (ref 5–15)
BUN: 15 mg/dL (ref 6–20)
CO2: 22 mmol/L (ref 22–32)
Calcium: 9 mg/dL (ref 8.9–10.3)
Chloride: 107 mmol/L (ref 98–111)
Creatinine, Ser: 1.33 mg/dL — ABNORMAL HIGH (ref 0.61–1.24)
GFR, Estimated: >60 mL/min (ref >60)
Glucose, Bld: 92 mg/dL (ref 70–99)
Potassium: 3.8 mmol/L (ref 3.5–5.1)
Sodium: 139 mmol/L (ref 135–145)
Total Bilirubin: 1 mg/dL (ref 0.0–1.2)
Total Protein: 7.9 g/dL (ref 6.5–8.1)

## 2024-07-02 LAB — URINALYSIS, ROUTINE W REFLEX MICROSCOPIC
Bilirubin Urine: NEGATIVE
Glucose, UA: NEGATIVE mg/dL
Ketones, ur: NEGATIVE mg/dL
Leukocytes,Ua: NEGATIVE
Nitrite: NEGATIVE
Protein, ur: 30 mg/dL — AB
RBC / HPF: >50 RBC/hpf (ref 0–5)
Specific Gravity, Urine: 1.028 (ref 1.005–1.030)
pH: 5 (ref 5.0–8.0)

## 2024-07-02 LAB — CBC WITH DIFFERENTIAL/PLATELET
Abs Immature Granulocytes: 0.05 K/uL (ref 0.00–0.07)
Basophils Absolute: 0.1 K/uL (ref 0.0–0.1)
Basophils Relative: 1 %
Eosinophils Absolute: 0.1 K/uL (ref 0.0–0.5)
Eosinophils Relative: 1 %
HCT: 47.6 % (ref 39.0–52.0)
Hemoglobin: 16.7 g/dL (ref 13.0–17.0)
Immature Granulocytes: 0 %
Lymphocytes Relative: 15 %
Lymphs Abs: 1.9 K/uL (ref 0.7–4.0)
MCH: 31.1 pg (ref 26.0–34.0)
MCHC: 35.1 g/dL (ref 30.0–36.0)
MCV: 88.6 fL (ref 80.0–100.0)
Monocytes Absolute: 0.8 K/uL (ref 0.1–1.0)
Monocytes Relative: 7 %
Neutro Abs: 9.8 K/uL — ABNORMAL HIGH (ref 1.7–7.7)
Neutrophils Relative %: 76 %
Platelets: 248 K/uL (ref 150–400)
RBC: 5.37 MIL/uL (ref 4.22–5.81)
RDW: 12.9 % (ref 11.5–15.5)
WBC: 12.7 K/uL — ABNORMAL HIGH (ref 4.0–10.5)
nRBC: 0 % (ref 0.0–0.2)

## 2024-07-02 LAB — LIPASE, BLOOD: Lipase: 39 U/L (ref 11–51)

## 2024-07-02 LAB — I-STAT CHEM 8, ED
BUN: 17 mg/dL (ref 6–20)
Calcium, Ion: 1.16 mmol/L (ref 1.15–1.40)
Chloride: 108 mmol/L (ref 98–111)
Creatinine, Ser: 1.3 mg/dL — ABNORMAL HIGH (ref 0.61–1.24)
Glucose, Bld: 95 mg/dL (ref 70–99)
HCT: 49 % (ref 39.0–52.0)
Hemoglobin: 16.7 g/dL (ref 13.0–17.0)
Potassium: 3.9 mmol/L (ref 3.5–5.1)
Sodium: 143 mmol/L (ref 135–145)
TCO2: 22 mmol/L (ref 22–32)

## 2024-07-02 MED ORDER — FENTANYL CITRATE PF 50 MCG/ML IJ SOSY
100.0000 ug | PREFILLED_SYRINGE | Freq: Once | INTRAMUSCULAR | Status: AC
  Administered 2024-07-02: 100 ug via INTRAVENOUS
  Filled 2024-07-02: qty 2

## 2024-07-02 MED ORDER — HYDROMORPHONE HCL 1 MG/ML IJ SOLN
1.0000 mg | Freq: Once | INTRAMUSCULAR | Status: AC
  Administered 2024-07-02: 1 mg via INTRAVENOUS
  Filled 2024-07-02: qty 1

## 2024-07-02 MED ORDER — SODIUM CHLORIDE 0.9 % IV SOLN
12.5000 mg | Freq: Once | INTRAVENOUS | Status: AC
  Administered 2024-07-02: 12.5 mg via INTRAVENOUS
  Filled 2024-07-02: qty 0.5

## 2024-07-02 MED ORDER — HYDROCODONE-ACETAMINOPHEN 5-325 MG PO TABS
1.0000 | ORAL_TABLET | Freq: Once | ORAL | Status: AC
  Administered 2024-07-02: 1 via ORAL
  Filled 2024-07-02: qty 1

## 2024-07-02 MED ORDER — MAGNESIUM SULFATE 2 GM/50ML IV SOLN
2.0000 g | Freq: Once | INTRAVENOUS | Status: AC
  Administered 2024-07-02: 2 g via INTRAVENOUS
  Filled 2024-07-02: qty 50

## 2024-07-02 MED ORDER — KETOROLAC TROMETHAMINE 30 MG/ML IJ SOLN
30.0000 mg | Freq: Once | INTRAMUSCULAR | Status: AC
  Administered 2024-07-02: 30 mg via INTRAVENOUS
  Filled 2024-07-02: qty 1

## 2024-07-02 MED ORDER — FENTANYL CITRATE PF 50 MCG/ML IJ SOSY
50.0000 ug | PREFILLED_SYRINGE | Freq: Once | INTRAMUSCULAR | Status: AC
  Administered 2024-07-02: 50 ug via INTRAVENOUS
  Filled 2024-07-02: qty 1

## 2024-07-02 MED ORDER — ONDANSETRON 4 MG PO TBDP
4.0000 mg | ORAL_TABLET | Freq: Once | ORAL | Status: AC
  Administered 2024-07-02: 4 mg via ORAL
  Filled 2024-07-02: qty 1

## 2024-07-02 NOTE — ED Triage Notes (Signed)
 Pt c/o of severe left sided abdominal pain getting worse, went to UC and they provided pain meds with no relief, zofran  alleviated nausea for a short time. Pt has had kidney stones before but this time is in a new location and more painful. Pain started in back last night and moving up front. Denies fever, positive for blood in urine and protein in urine. Pt also c/o of nausea and vomiting.

## 2024-07-02 NOTE — ED Notes (Signed)
 Pt unable to provide urine sample at this time. Sample cup provided to pt.

## 2024-07-02 NOTE — H&P (Signed)
 History and Physical    Jimmy Serrano FMW:[id number] DOB: Apr 13, 1981 DOA: 07/02/2024  PCP: Suanne Pfeiffer, NP  Patient coming from: home  I have personally briefly reviewed patient's old medical records in [facility]  Chief Complaint: severe left abdominal pain, n/v  HPI: Jimmy Serrano is a 43 y.o. male with medical history significant of HTN, fatty liver, HLD, polycythemia,renal stone  who presents to with severe left abdominal and n/v.Per patient pain started last pm in left flank and then moved to left abdomen. Patient  notes associated n/v but no fever/chills / chest pain, dysuria or sob. Patient also endorses blood in urine as  well. Patient currently notes symptoms have improved s/p treatment in ED.    ED Course:  In ed on evaluation patient found to have  3mm renal stone on CTAB/pelvis.  Patient was treated with anti-emetic as well as pain medication in ED. However still was not able to gain adequate pain relief and continued to have n/v. Due to this patient is being admitted for observation of pain control.   Vitals:  Afeb, bp 148/121, hr 97, rr 18  Labs  Wbc 12.7, hgb 16.7, plt 248 UA : +bacteria, >50 rbc  Na: 139, K 3.8, Cl 107, cr 1.33 ( 1.22) Liapse 39  CT stone  IMPRESSION: 1. 3 mm stone in the proximal left ureter with moderate proximal obstruction.   Tx  norco, zofran ,ketoralc, fentanyl , phenegran, dilaudid , magnesium   Review of Systems: As per HPI otherwise 10 point review of systems negative.   Past Medical History:  Diagnosis Date   Kidney stones     Past Surgical History:  Procedure Laterality Date   CHOLECYSTECTOMY       reports that he has never smoked. He has never used smokeless tobacco. He reports that he does not drink alcohol and does not use drugs.  No Known Allergies  History reviewed. No pertinent family history.  Prior to Admission medications   Medication Sig Start Date End Date Taking? Authorizing Provider  ibuprofen   (ADVIL ) 200 MG tablet Take 600 mg by mouth as needed for headache.    [provider]  ondansetron  (ZOFRAN -ODT) 4 MG disintegrating tablet Take 1 tablet (4 mg total) by mouth every 8 (eight) hours as needed for nausea or vomiting. 06/25/23   Vernon Ranks, MD    Physical Exam: Vitals:   07/02/24 2100 07/02/24 2130 07/02/24 2200 07/02/24 2300  BP: 124/87 124/80 (!) 131/92 119/85  Pulse: 75 71 78 77  Resp: 17 14 15 16   Temp:      TempSrc:      SpO2: 94% 92% 94% 92%  Weight:      Height:        Constitutional: NAD, calm, comfortable Vitals:   07/02/24 2100 07/02/24 2130 07/02/24 2200 07/02/24 2300  BP: 124/87 124/80 (!) 131/92 119/85  Pulse: 75 71 78 77  Resp: 17 14 15 16   Temp:      TempSrc:      SpO2: 94% 92% 94% 92%  Weight:      Height:       Eyes: lids and conjunctivae normal ENMT: Mucous membranes are moist. Posterior pharynx clear of any exudate or lesions.Normal dentition.  Neck: normal, supple, no masses, no thyromegaly Respiratory: clear to auscultation bilaterally, no wheezing, no crackles. Normal respiratory effort. No accessory muscle use.  Cardiovascular: Regular rate and rhythm, no murmurs / rubs / gallops. No extremity edema. 2+ pedal pulses. No carotid bruits.  Abdomen: mild tenderness, no masses palpated. No hepatosplenomegaly. Bowel sounds positive.  Musculoskeletal: no clubbing / cyanosis. No joint deformity upper and lower extremities. Good ROM, no contractures. Normal muscle tone.  Skin: no rashes, lesions, ulcers. No induration Neurologic: CN grossly intact. Sensation intact,Strength 5/5 in all 4.  Psychiatric: Normal judgment and insight. Alert and oriented x 3. Normal mood.    Labs on Admission: I have personally reviewed following labs and imaging studies  CBC: Recent Labs  Lab 07/02/24 1802 07/02/24 1829  WBC 12.7*  --   NEUTROABS 9.8*  --   HGB 16.7 16.7  HCT 47.6 49.0  MCV 88.6  --   PLT 248  --    Basic Metabolic Panel: Recent  Labs  Lab 07/02/24 1802 07/02/24 1829  NA 139 143  K 3.8 3.9  CL 107 108  CO2 22  --   GLUCOSE 92 95  BUN 15 17  CREATININE 1.33* 1.30*  CALCIUM 9.0  --    GFR: Estimated Creatinine Clearance: 88.7 mL/min (A) (by C-G formula based on SCr of 1.3 mg/dL (H)). Liver Function Tests: Recent Labs  Lab 07/02/24 1802  AST 25  ALT 27  ALKPHOS 68  BILITOT 1.0  PROT 7.9  ALBUMIN 4.5   Recent Labs  Lab 07/02/24 1802  LIPASE 39   No results for input(s): AMMONIA in the last 168 hours. Coagulation Profile: No results for input(s): INR, PROTIME in the last 168 hours. Cardiac Enzymes: No results for input(s): CKTOTAL, CKMB, CKMBINDEX, TROPONINI in the last 168 hours. BNP (last 3 results) No results for input(s): PROBNP in the last 8760 hours. HbA1C: No results for input(s): HGBA1C in the last 72 hours. CBG: No results for input(s): GLUCAP in the last 168 hours. Lipid Profile: No results for input(s): CHOL, HDL, LDLCALC, TRIG, CHOLHDL, LDLDIRECT in the last 72 hours. Thyroid Function Tests: No results for input(s): TSH, T4TOTAL, FREET4, T3FREE, THYROIDAB in the last 72 hours. Anemia Panel: No results for input(s): VITAMINB12, FOLATE, FERRITIN, TIBC, IRON, RETICCTPCT in the last 72 hours. Urine analysis:    Component Value Date/Time   COLORURINE YELLOW 07/02/2024 1802   APPEARANCEUR HAZY (A) 07/02/2024 1802   LABSPEC 1.028 07/02/2024 1802   PHURINE 5.0 07/02/2024 1802   GLUCOSEU NEGATIVE 07/02/2024 1802   HGBUR LARGE (A) 07/02/2024 1802   BILIRUBINUR NEGATIVE 07/02/2024 1802   BILIRUBINUR negative 07/07/2023 1347   KETONESUR NEGATIVE 07/02/2024 1802   PROTEINUR 30 (A) 07/02/2024 1802   UROBILINOGEN 0.2 07/07/2023 1347   UROBILINOGEN 0.2 04/08/2011 1107   NITRITE NEGATIVE 07/02/2024 1802   LEUKOCYTESUR NEGATIVE 07/02/2024 1802    Radiological Exams on Admission: CT Renal Stone Study Result Date: 07/02/2024 CLINICAL  DATA:  Left-sided flank pain. Severe left abdominal pain progressively worsening. Microscopic hemoglobin in the urine. History of kidney stones. EXAM: CT ABDOMEN AND PELVIS WITHOUT CONTRAST TECHNIQUE: Multidetector CT imaging of the abdomen and pelvis was performed following the standard protocol without IV contrast. RADIATION DOSE REDUCTION: This exam was performed according to the departmental dose-optimization program which includes automated exposure control, adjustment of the mA and/or kV according to patient size and/or use of iterative reconstruction technique. COMPARISON:  CT chest abdomen pelvis 06/24/2024 FINDINGS: Lower chest: Lung bases are clear. Hepatobiliary: No focal liver abnormality is seen. Status post cholecystectomy. No biliary dilatation. Pancreas: Unremarkable. No pancreatic ductal dilatation or surrounding inflammatory changes. Spleen: Normal in size without focal abnormality. Adrenals/Urinary Tract: No adrenal gland nodules. 3 mm stone in the proximal left  ureter at the ureteropelvic junction. Mild left hydronephrosis. Distal ureter is decompressed. Mild stranding around the left kidney. Right kidney, right ureter, and the bladder are unremarkable. Stomach/Bowel: Stomach is within normal limits. Appendix appears normal. No evidence of bowel wall thickening, distention, or inflammatory changes. Vascular/Lymphatic: No significant vascular findings are present. No enlarged abdominal or pelvic lymph nodes. Reproductive: Prostate is unremarkable. Other: No abdominal wall hernia or abnormality. No abdominopelvic ascites. Musculoskeletal: No acute or significant osseous findings. IMPRESSION: 1. 3 mm stone in the proximal left ureter with moderate proximal obstruction. Electronically Signed   By: Elsie Gravely M.D.   On: 07/02/2024 19:46    EKG: Independently reviewed.   Assessment/Plan    Renal Colic , refractory  -ct stone , 3mm stone  -s/p multiple rounds of antiemetics and pain  medication  -admit for pain control  -continue with ivfs -start flomax   - prn pain medication and antiemetics   Leukocytosis  -presumed related to stress response  -will continue to monitor  -will monitor trend  Abn UA - to be complete will f/u with urine culture    HTN -initially elevated due to pain  - not currently on bp medication as outpatient    Fatty liver -no active issues    HLD - diet controlled    DVT prophylaxis: scd Code Status: full/ as discussed per patient wishes in event of cardiac arrest  Family Communication:  none at bedside Disposition Plan: patient  expected to be admitted greater than 2 midnights  Consults called:  n/a Admission status: med tele   Camila DELENA Ned MD [facility]   If 7PM-7AM, please contact night-coverage [email or web address] Password [facility]  07/02/2024, 11:55 PM

## 2024-07-02 NOTE — ED Provider Triage Note (Signed)
 Emergency Medicine Provider Triage Evaluation Note  Jimmy Serrano , a 43 y.o. male  was evaluated in triage.  Pt complains of severe left-sided abdominal pain that is progressively worsening.  History of cholecystectomy and hernia repair.  States he was seen at urgent care and had some microscopic hemoglobin in his urine.  history of kidney stone  Review of Systems  Positive: As above Negative: As above  Physical Exam  Ht 6' (1.829 m)   Wt 102.1 kg   BMI 30.52 kg/m  Gen:   Awake, no distress   Resp:  Normal effort  MSK:   Moves extremities without difficulty  Other:    Medical Decision Making  Medically screening exam initiated at 6:01 PM.  Appropriate orders placed.  Jimmy Serrano was informed that the remainder of the evaluation will be completed by another provider, this initial triage assessment does not replace that evaluation, and the importance of remaining in the ED until their evaluation is complete.     Hildegard Loge, PA-C 07/02/24 [phone]

## 2024-07-02 NOTE — ED Provider Notes (Signed)
 [place] [facility] Provider Note   CSN: [id number] Arrival date & time: 07/02/24  1731     Patient presents with: Abdominal Pain   Jimmy Serrano is a 43 y.o. male here with acute onset left sided flank pain, onset 0400 this morning, moved towards left lower abdomen now, sharp and persistent.  Hx of renal stones.  Pt vomiting from pain.  {Add pertinent medical, surgical, social history, OB history to HPI:32947} HPI     Prior to Admission medications   Medication Sig Start Date End Date Taking? Authorizing Provider  ibuprofen  (ADVIL ) 200 MG tablet Take 600 mg by mouth as needed for headache.    [provider]  ondansetron  (ZOFRAN -ODT) 4 MG disintegrating tablet Take 1 tablet (4 mg total) by mouth every 8 (eight) hours as needed for nausea or vomiting. 06/25/23   Vernon Ranks, MD    Allergies: Patient has no known allergies.    Review of Systems  Updated Vital Signs BP (!) 148/121 (BP Location: Right Arm)   Pulse 97   Temp 97.8 F (36.6 C)   Resp 18   Ht 6' (1.829 m)   Wt 102.1 kg   SpO2 98%   BMI 30.52 kg/m   Physical Exam Constitutional:      General: He is not in acute distress.    Comments: Dry heaving  HENT:     Head: Normocephalic and atraumatic.  Eyes:     Conjunctiva/sclera: Conjunctivae normal.     Pupils: Pupils are equal, round, and reactive to light.  Cardiovascular:     Rate and Rhythm: Normal rate and regular rhythm.  Pulmonary:     Effort: Pulmonary effort is normal. No respiratory distress.  Abdominal:     General: There is no distension.     Tenderness: There is no abdominal tenderness.  Skin:    General: Skin is warm and dry.  Neurological:     General: No focal deficit present.     Mental Status: He is alert. Mental status is at baseline.  Psychiatric:        Mood and Affect: Mood normal.        Behavior: Behavior normal.     (all labs ordered are listed, but only abnormal results are  displayed) Labs Reviewed  I-STAT CHEM 8, ED - Abnormal; Notable for the following components:      Result Value   Creatinine, Ser 1.30 (*)    All other components within normal limits  CBC WITH DIFFERENTIAL/PLATELET  COMPREHENSIVE METABOLIC PANEL WITH GFR  LIPASE, BLOOD  URINALYSIS, ROUTINE W REFLEX MICROSCOPIC    EKG: None  Radiology: No results found.  {Document cardiac monitor, telemetry assessment procedure when appropriate:32947} Procedures   Medications Ordered in the ED  fentaNYL  (SUBLIMAZE ) injection 100 mcg (has no administration in time range)  promethazine  (PHENERGAN ) 12.5 mg in sodium chloride  0.9 % 50 mL IVPB (has no administration in time range)  ketorolac  (TORADOL ) 30 MG/ML injection 30 mg (has no administration in time range)  ondansetron  (ZOFRAN -ODT) disintegrating tablet 4 mg (4 mg Oral Given 07/02/24 1807)  HYDROcodone -acetaminophen  (NORCO/VICODIN) 5-325 MG per tablet 1 tablet (1 tablet Oral Given 07/02/24 1806)      {Click here for ABCD2, HEART and other calculators REFRESH Note before signing:1}                              Medical Decision Making Amount  and/or Complexity of Data Reviewed Radiology: ordered.  Risk Prescription drug management.   This patient presents to the ED with concern for colicky flank and abd pain. This involves an extensive number of treatment options, and is a complaint that carries with it a high risk of complications and morbidity.  The differential diagnosis includes UTI vs ureteral colic vs other   I ordered and personally interpreted labs.  The pertinent results include:  ***  I ordered imaging studies including ct abd pelvis  I independently visualized and interpreted imaging which showed *** I agree with the radiologist interpretation  I ordered medication including IV pain and nausea medications  I have reviewed the patients home medicines and have made adjustments as needed  Test Considered: doubt AAA,  mesenteric ischemia, abscess, torsion  I requested consultation with the ***,  and discussed lab and imaging findings as well as pertinent plan - they recommend: ***  After the interventions noted above, I reevaluated the patient and found that they have: {resolved/improved/worsened:23923::improved}  Social Determinants of Health:***  Dispostion:  After consideration of the diagnostic results and the patients response to treatment, I feel that the patent would benefit from ***.   {Document critical care time when appropriate  Document review of labs and clinical decision tools ie CHADS2VASC2, etc  Document your independent review of radiology images and any outside records  Document your discussion with family members, caretakers and with consultants  Document social determinants of health affecting pt's care  Document your decision making why or why not admission, treatments were needed:32947:::1}   Final diagnoses:  None    ED Discharge Orders     None

## 2024-07-02 NOTE — H&P (Incomplete)
 History and Physical    Jimmy Serrano FMW:[id number] DOB: [birth year] DOA: 07/02/2024  PCP: Suanne Pfeiffer, NP  Patient coming from: home  I have personally briefly reviewed patient's old medical records in [facility]  Chief Complaint: severe left abdominal pain, n/v  HPI: Jimmy Serrano is a 43 y.o. male with medical history significant of HTN, fatty liver, HLD, polycythemia,renal stone  who presents to with severe left abdominal and n/v.Per patient patint started last pmh  left flank and then moved to left abdomen. Patient  notes associated n/v but no fever/chills / chest pain, or sob. Patient also endorses blood in urine as  well.   ED Course:  In ed on evaluation patient found to have  3mm renal stone on CTAB/pelvis.  Patient was treated with anti-emetic as well as pain medication in ED. However still was not able to gain adequate pain relief and continued to have n/v. Due to this patient is being admitted for observation of pain control.     Review of Systems: As per HPI otherwise 10 point review of systems negative.   Past Medical History:  Diagnosis Date  . Kidney stones     Past Surgical History:  Procedure Laterality Date  . CHOLECYSTECTOMY       reports that he has never smoked. He has never used smokeless tobacco. He reports that he does not drink alcohol and does not use drugs.  No Known Allergies  History reviewed. No pertinent family history. *** Prior to Admission medications   Medication Sig Start Date End Date Taking? Authorizing Provider  ibuprofen  (ADVIL ) 200 MG tablet Take 600 mg by mouth as needed for headache.    [provider]  ondansetron  (ZOFRAN -ODT) 4 MG disintegrating tablet Take 1 tablet (4 mg total) by mouth every 8 (eight) hours as needed for nausea or vomiting. 06/25/23   Vernon Ranks, MD    Physical Exam: Vitals:   07/02/24 2100 07/02/24 2130 07/02/24 2200 07/02/24 2300  BP: 124/87 124/80 (!) 131/92 119/85  Pulse: 75  71 78 77  Resp: 17 14 15 16   Temp:      TempSrc:      SpO2: 94% 92% 94% 92%  Weight:      Height:        Constitutional: NAD, calm, comfortable Vitals:   07/02/24 2100 07/02/24 2130 07/02/24 2200 07/02/24 2300  BP: 124/87 124/80 (!) 131/92 119/85  Pulse: 75 71 78 77  Resp: 17 14 15 16   Temp:      TempSrc:      SpO2: 94% 92% 94% 92%  Weight:      Height:       Eyes: PERRL, lids and conjunctivae normal ENMT: Mucous membranes are moist. Posterior pharynx clear of any exudate or lesions.Normal dentition.  Neck: normal, supple, no masses, no thyromegaly Respiratory: clear to auscultation bilaterally, no wheezing, no crackles. Normal respiratory effort. No accessory muscle use.  Cardiovascular: Regular rate and rhythm, no murmurs / rubs / gallops. No extremity edema. 2+ pedal pulses. No carotid bruits.  Abdomen: no tenderness, no masses palpated. No hepatosplenomegaly. Bowel sounds positive.  Musculoskeletal: no clubbing / cyanosis. No joint deformity upper and lower extremities. Good ROM, no contractures. Normal muscle tone.  Skin: no rashes, lesions, ulcers. No induration Neurologic: CN 2-12 grossly intact. Sensation intact, DTR normal. Strength 5/5 in all 4.  Psychiatric: Normal judgment and insight. Alert and oriented x 3. Normal mood.    Labs on Admission: I have  personally reviewed following labs and imaging studies  CBC: Recent Labs  Lab 07/02/24 1802 07/02/24 1829  WBC 12.7*  --   NEUTROABS 9.8*  --   HGB 16.7 16.7  HCT 47.6 49.0  MCV 88.6  --   PLT 248  --    Basic Metabolic Panel: Recent Labs  Lab 07/02/24 1802 07/02/24 1829  NA 139 143  K 3.8 3.9  CL 107 108  CO2 22  --   GLUCOSE 92 95  BUN 15 17  CREATININE 1.33* 1.30*  CALCIUM 9.0  --    GFR: Estimated Creatinine Clearance: 88.7 mL/min (A) (by C-G formula based on SCr of 1.3 mg/dL (H)). Liver Function Tests: Recent Labs  Lab 07/02/24 1802  AST 25  ALT 27  ALKPHOS 68  BILITOT 1.0  PROT 7.9   ALBUMIN 4.5   Recent Labs  Lab 07/02/24 1802  LIPASE 39   No results for input(s): AMMONIA in the last 168 hours. Coagulation Profile: No results for input(s): INR, PROTIME in the last 168 hours. Cardiac Enzymes: No results for input(s): CKTOTAL, CKMB, CKMBINDEX, TROPONINI in the last 168 hours. BNP (last 3 results) No results for input(s): PROBNP in the last 8760 hours. HbA1C: No results for input(s): HGBA1C in the last 72 hours. CBG: No results for input(s): GLUCAP in the last 168 hours. Lipid Profile: No results for input(s): CHOL, HDL, LDLCALC, TRIG, CHOLHDL, LDLDIRECT in the last 72 hours. Thyroid Function Tests: No results for input(s): TSH, T4TOTAL, FREET4, T3FREE, THYROIDAB in the last 72 hours. Anemia Panel: No results for input(s): VITAMINB12, FOLATE, FERRITIN, TIBC, IRON, RETICCTPCT in the last 72 hours. Urine analysis:    Component Value Date/Time   COLORURINE YELLOW 07/02/2024 1802   APPEARANCEUR HAZY (A) 07/02/2024 1802   LABSPEC 1.028 07/02/2024 1802   PHURINE 5.0 07/02/2024 1802   GLUCOSEU NEGATIVE 07/02/2024 1802   HGBUR LARGE (A) 07/02/2024 1802   BILIRUBINUR NEGATIVE 07/02/2024 1802   BILIRUBINUR negative 07/07/2023 1347   KETONESUR NEGATIVE 07/02/2024 1802   PROTEINUR 30 (A) 07/02/2024 1802   UROBILINOGEN 0.2 07/07/2023 1347   UROBILINOGEN 0.2 04/08/2011 1107   NITRITE NEGATIVE 07/02/2024 1802   LEUKOCYTESUR NEGATIVE 07/02/2024 1802    Radiological Exams on Admission: CT Renal Stone Study Result Date: 07/02/2024 CLINICAL DATA:  Left-sided flank pain. Severe left abdominal pain progressively worsening. Microscopic hemoglobin in the urine. History of kidney stones. EXAM: CT ABDOMEN AND PELVIS WITHOUT CONTRAST TECHNIQUE: Multidetector CT imaging of the abdomen and pelvis was performed following the standard protocol without IV contrast. RADIATION DOSE REDUCTION: This exam was performed according to  the departmental dose-optimization program which includes automated exposure control, adjustment of the mA and/or kV according to patient size and/or use of iterative reconstruction technique. COMPARISON:  CT chest abdomen pelvis 06/24/2024 FINDINGS: Lower chest: Lung bases are clear. Hepatobiliary: No focal liver abnormality is seen. Status post cholecystectomy. No biliary dilatation. Pancreas: Unremarkable. No pancreatic ductal dilatation or surrounding inflammatory changes. Spleen: Normal in size without focal abnormality. Adrenals/Urinary Tract: No adrenal gland nodules. 3 mm stone in the proximal left ureter at the ureteropelvic junction. Mild left hydronephrosis. Distal ureter is decompressed. Mild stranding around the left kidney. Right kidney, right ureter, and the bladder are unremarkable. Stomach/Bowel: Stomach is within normal limits. Appendix appears normal. No evidence of bowel wall thickening, distention, or inflammatory changes. Vascular/Lymphatic: No significant vascular findings are present. No enlarged abdominal or pelvic lymph nodes. Reproductive: Prostate is unremarkable. Other: No abdominal wall hernia or  abnormality. No abdominopelvic ascites. Musculoskeletal: No acute or significant osseous findings. IMPRESSION: 1. 3 mm stone in the proximal left ureter with moderate proximal obstruction. Electronically Signed   By: Elsie Gravely M.D.   On: 07/02/2024 19:46    EKG: Independently reviewed. ***  Assessment/Plan Principal Problem:   Renal stone   ***  DVT prophylaxis: *** (Lovenox /Heparin/SCD's/anticoagulated/None (if comfort care) Code Status: *** (Full/Partial (specify details) Family Communication: *** (Specify name, relationship. Do not write discussed with patient. Specify tel # if discussed over the phone) Disposition Plan: *** (specify when and where you expect patient to be discharged) Consults called: *** (with names) Admission status: *** (inpatient / obs / tele /  medical floor / SDU)   Camila DELENA Ned MD [facility] Pager [phone]  If 7PM-7AM, please contact night-coverage [email or web address] Password [facility]  07/02/2024, 11:55 PM

## 2024-07-02 NOTE — ED Notes (Signed)
 Pt received pain medication then vomited approx. 10 minutes later. MD notified

## 2024-07-02 NOTE — ED Notes (Signed)
Pt vomited meds up.

## 2024-07-03 DIAGNOSIS — N200 Calculus of kidney: Secondary | ICD-10-CM | POA: Diagnosis not present

## 2024-07-03 LAB — LACTIC ACID, PLASMA
Lactic Acid, Venous: 2 mmol/L (ref 0.5–1.9)
Lactic Acid, Venous: 2.2 mmol/L (ref 0.5–1.9)

## 2024-07-03 LAB — BASIC METABOLIC PANEL WITH GFR
Anion gap: 9 (ref 5–15)
BUN: 17 mg/dL (ref 6–20)
CO2: 22 mmol/L (ref 22–32)
Calcium: 8.4 mg/dL — ABNORMAL LOW (ref 8.9–10.3)
Chloride: 106 mmol/L (ref 98–111)
Creatinine, Ser: 1.4 mg/dL — ABNORMAL HIGH (ref 0.61–1.24)
GFR, Estimated: >60 mL/min (ref >60)
Glucose, Bld: 115 mg/dL — ABNORMAL HIGH (ref 70–99)
Potassium: 5 mmol/L (ref 3.5–5.1)
Sodium: 137 mmol/L (ref 135–145)

## 2024-07-03 LAB — CBC
HCT: 42.3 % (ref 39.0–52.0)
Hemoglobin: 14.6 g/dL (ref 13.0–17.0)
MCH: 30.9 pg (ref 26.0–34.0)
MCHC: 34.5 g/dL (ref 30.0–36.0)
MCV: 89.6 fL (ref 80.0–100.0)
Platelets: 213 K/uL (ref 150–400)
RBC: 4.72 MIL/uL (ref 4.22–5.81)
RDW: 13.1 % (ref 11.5–15.5)
WBC: 11.9 K/uL — ABNORMAL HIGH (ref 4.0–10.5)
nRBC: 0 % (ref 0.0–0.2)

## 2024-07-03 LAB — C-REACTIVE PROTEIN: CRP: 0.5 mg/dL (ref <1.0)

## 2024-07-03 MED ORDER — SODIUM CHLORIDE 0.9 % IV SOLN: INTRAVENOUS | Status: AC

## 2024-07-03 MED ORDER — SODIUM CHLORIDE 0.9 % IV BOLUS
1000.0000 mL | Freq: Once | INTRAVENOUS | Status: AC
  Administered 2024-07-03: 1000 mL via INTRAVENOUS

## 2024-07-03 MED ORDER — TAMSULOSIN HCL 0.4 MG PO CAPS
0.4000 mg | ORAL_CAPSULE | Freq: Every day | ORAL | Status: DC
  Administered 2024-07-03 – 2024-07-05 (×4): 0.4 mg via ORAL
  Filled 2024-07-03 (×4): qty 1

## 2024-07-03 MED ORDER — ACETAMINOPHEN 650 MG RE SUPP: 650.0000 mg | Freq: Four times a day (QID) | RECTAL | Status: DC | PRN

## 2024-07-03 MED ORDER — KETOROLAC TROMETHAMINE 30 MG/ML IJ SOLN
30.0000 mg | Freq: Four times a day (QID) | INTRAMUSCULAR | Status: DC | PRN
  Administered 2024-07-03: 30 mg via INTRAVENOUS
  Filled 2024-07-03: qty 1

## 2024-07-03 MED ORDER — FENTANYL CITRATE PF 50 MCG/ML IJ SOSY: 25.0000 ug | PREFILLED_SYRINGE | INTRAMUSCULAR | Status: DC | PRN

## 2024-07-03 MED ORDER — KETOROLAC TROMETHAMINE 30 MG/ML IJ SOLN: 15.0000 mg | Freq: Four times a day (QID) | INTRAMUSCULAR | Status: DC | PRN

## 2024-07-03 MED ORDER — OXYCODONE HCL 5 MG PO TABS
5.0000 mg | ORAL_TABLET | ORAL | Status: DC | PRN
  Administered 2024-07-03 – 2024-07-04 (×4): 5 mg via ORAL
  Filled 2024-07-03 (×5): qty 1

## 2024-07-03 MED ORDER — ONDANSETRON HCL 4 MG/2ML IJ SOLN
4.0000 mg | Freq: Four times a day (QID) | INTRAMUSCULAR | Status: DC | PRN
  Administered 2024-07-03 – 2024-07-04 (×2): 4 mg via INTRAVENOUS
  Filled 2024-07-03 (×2): qty 2

## 2024-07-03 MED ORDER — ALBUTEROL SULFATE (2.5 MG/3ML) 0.083% IN NEBU: 2.5000 mg | INHALATION_SOLUTION | RESPIRATORY_TRACT | Status: DC | PRN

## 2024-07-03 MED ORDER — ACETAMINOPHEN 325 MG PO TABS
650.0000 mg | ORAL_TABLET | Freq: Four times a day (QID) | ORAL | Status: DC | PRN
Start: 2024-07-03 — End: 2024-07-05
  Administered 2024-07-03 – 2024-07-04 (×2): 650 mg via ORAL
  Filled 2024-07-03 (×2): qty 2

## 2024-07-03 MED ORDER — ONDANSETRON HCL 4 MG PO TABS: 4.0000 mg | ORAL_TABLET | Freq: Four times a day (QID) | ORAL | Status: DC | PRN

## 2024-07-03 NOTE — Plan of Care (Signed)
   Problem: Education: Goal: Knowledge of General Education information will improve Description: Including pain rating scale, medication(s)/side effects and non-pharmacologic comfort measures Outcome: Completed/Met

## 2024-07-03 NOTE — Progress Notes (Signed)
 PROGRESS NOTE    Jimmy Serrano  FMW:[id number] DOB: 1981/09/29 DOA: 07/02/2024 PCP: Suanne Pfeiffer, NP   Brief Narrative: 43 year old with past medical history significant for hypertension, fatty liver, hyperlipidemia, polycythemia, Raynaud's done who presents with severe left abdominal pain, nausea and vomiting.  Patient was found to have a 3 mm left ureter stone on CT abdomen and pelvis.   Assessment & Plan:   Principal Problem:   Renal stone   1-Left ureter stone with moderate obstruction, 3 mm -Presented with flank pain, nausea vomiting.  UA with no significant white blood cells only 0-5 -CT renal protocol: Showed 3 mm left ureteral stone causing moderate obstruction. -Urine culture order, FU - Continue with IV fluids, will give him IV bolus.  Continue pain management. -Was started on Flomax  Home tomorrow if he is able to tolerate diet Follow-up with urology as an outpatient  Leukocytosis Trending down.  Urine culture pending  Hypertension; - Initially was elevated due to pain.  He is not currently on blood pressure medication.  Will need to monitor  Fatty liver - Follow-up as an outpatient  Hyperlipidemia - Diet controlled    Estimated body mass index is 30.52 kg/m as calculated from the following:   Height as of this encounter: 6' (1.829 m).   Weight as of this encounter: 102.1 kg.   DVT prophylaxis: SCDs Code Status: Full code Family Communication: Wife who was at bedside Disposition Plan:  Status is: Inpatient Remains inpatient appropriate because: Management of ureteral stone    Consultants:  None  Procedures:    Antimicrobials:    Subjective: He reports pain is a little bit better controlled.  No vomiting since last night.  He is willing to try full liquid diet, not too many options on the liquid diet  Objective: Vitals:   07/03/24 0000 07/03/24 0100 07/03/24 0107 07/03/24 0405  BP: 117/87 122/77 122/77 107/71  Pulse: 68 71 71 70   Resp: 12     Temp:  98 F (36.7 C) 98 F (36.7 C) 98 F (36.7 C)  TempSrc:  Oral Oral Oral  SpO2: 90% 97% 97% 96%  Weight:      Height:        Intake/Output Summary (Last 24 hours) at 07/03/2024 0720 Last data filed at 07/03/2024 0400 Gross per 24 hour  Intake 493.19 ml  Output --  Net 493.19 ml   Filed Weights   07/02/24 1758  Weight: 102.1 kg    Examination:  General exam: Appears calm and comfortable  Respiratory system: Clear to auscultation. Respiratory effort normal. Cardiovascular system: S1 & S2 heard, RRR. No JVD, murmurs, rubs, gallops or clicks. No pedal edema. Gastrointestinal system: Abdomen is nondistended, soft and nontender. No organomegaly or masses felt. Normal bowel sounds heard. Central nervous system: Alert and oriented. No focal neurological deficits. Extremities: Symmetric 5 x 5 power.    Data Reviewed: I have personally reviewed following labs and imaging studies  CBC: Recent Labs  Lab 07/02/24 1802 07/02/24 1829 07/03/24 0510  WBC 12.7*  --  11.9*  NEUTROABS 9.8*  --   --   HGB 16.7 16.7 14.6  HCT 47.6 49.0 42.3  MCV 88.6  --  89.6  PLT 248  --  213   Basic Metabolic Panel: Recent Labs  Lab 07/02/24 1802 07/02/24 1829  NA 139 143  K 3.8 3.9  CL 107 108  CO2 22  --   GLUCOSE 92 95  BUN 15 17  CREATININE 1.33*  1.30*  CALCIUM 9.0  --    GFR: Estimated Creatinine Clearance: 88.7 mL/min (A) (by C-G formula based on SCr of 1.3 mg/dL (H)). Liver Function Tests: Recent Labs  Lab 07/02/24 1802  AST 25  ALT 27  ALKPHOS 68  BILITOT 1.0  PROT 7.9  ALBUMIN 4.5   Recent Labs  Lab 07/02/24 1802  LIPASE 39   No results for input(s): AMMONIA in the last 168 hours. Coagulation Profile: No results for input(s): INR, PROTIME in the last 168 hours. Cardiac Enzymes: No results for input(s): CKTOTAL, CKMB, CKMBINDEX, TROPONINI in the last 168 hours. BNP (last 3 results) No results for input(s): PROBNP in the last  8760 hours. HbA1C: No results for input(s): HGBA1C in the last 72 hours. CBG: No results for input(s): GLUCAP in the last 168 hours. Lipid Profile: No results for input(s): CHOL, HDL, LDLCALC, TRIG, CHOLHDL, LDLDIRECT in the last 72 hours. Thyroid Function Tests: No results for input(s): TSH, T4TOTAL, FREET4, T3FREE, THYROIDAB in the last 72 hours. Anemia Panel: No results for input(s): VITAMINB12, FOLATE, FERRITIN, TIBC, IRON, RETICCTPCT in the last 72 hours. Sepsis Labs: Recent Labs  Lab 07/03/24 0124 07/03/24 0510  LATICACIDVEN 2.0* 2.2*    No results found for this or any previous visit (from the past 240 hours).       Radiology Studies: CT Renal Stone Study Result Date: 07/02/2024 CLINICAL DATA:  Left-sided flank pain. Severe left abdominal pain progressively worsening. Microscopic hemoglobin in the urine. History of kidney stones. EXAM: CT ABDOMEN AND PELVIS WITHOUT CONTRAST TECHNIQUE: Multidetector CT imaging of the abdomen and pelvis was performed following the standard protocol without IV contrast. RADIATION DOSE REDUCTION: This exam was performed according to the departmental dose-optimization program which includes automated exposure control, adjustment of the mA and/or kV according to patient size and/or use of iterative reconstruction technique. COMPARISON:  CT chest abdomen pelvis 06/24/2024 FINDINGS: Lower chest: Lung bases are clear. Hepatobiliary: No focal liver abnormality is seen. Status post cholecystectomy. No biliary dilatation. Pancreas: Unremarkable. No pancreatic ductal dilatation or surrounding inflammatory changes. Spleen: Normal in size without focal abnormality. Adrenals/Urinary Tract: No adrenal gland nodules. 3 mm stone in the proximal left ureter at the ureteropelvic junction. Mild left hydronephrosis. Distal ureter is decompressed. Mild stranding around the left kidney. Right kidney, right ureter, and the bladder are  unremarkable. Stomach/Bowel: Stomach is within normal limits. Appendix appears normal. No evidence of bowel wall thickening, distention, or inflammatory changes. Vascular/Lymphatic: No significant vascular findings are present. No enlarged abdominal or pelvic lymph nodes. Reproductive: Prostate is unremarkable. Other: No abdominal wall hernia or abnormality. No abdominopelvic ascites. Musculoskeletal: No acute or significant osseous findings. IMPRESSION: 1. 3 mm stone in the proximal left ureter with moderate proximal obstruction. Electronically Signed   By: Elsie Gravely M.D.   On: 07/02/2024 19:46        Scheduled Meds:  tamsulosin   0.4 mg Oral Daily   Continuous Infusions:  sodium chloride  150 mL/hr at 07/03/24 0110     LOS: 1 day    Time spent: 35 minutes    Daegan Arizmendi A Aamina Skiff, MD [facility]   If 7PM-7AM, please contact night-coverage [email or web address]  07/03/2024, 7:20 AM

## 2024-07-03 NOTE — Plan of Care (Signed)
  Problem: Pain Management: Goal: Satisfaction with pain management regimen will be met by discharge Outcome: Progressing  Interventions Explore beliefs about pain Assist with comfortable positioning Explore complementary and alternative therapies Encourage distraction activities Provide administration of medications prior to painful activities Observe non-verbal cues of discomfort, such as restlessness, muscle tension, or altered vital signs

## 2024-07-03 NOTE — TOC CM/SW Note (Addendum)
 Transition of Care [facility]) - Inpatient Brief Assessment   Patient Details  Name: Jimmy Serrano MRN: [id number] Date of Birth: 1981/06/08  Transition of Care [facility]) CM/SW Contact:    Tom-Johnson, Harvest Muskrat, RN Phone Number: 07/03/2024, 11:27 AM   Clinical Narrative:  Patient presented to the ED with severe Lt Abdominal Flank pain with N/V. CT Abd/Pelvis showed 3 mm Renal Stone. Patient started on Flomax , on IV fluids, anti-emetic and pain management.   From home with wife and three children. Employed, independent with care and drive self. Does not have DME's at home.  PCP is Suanne Pfeiffer, NP and uses [facility] on [address] in [place].   Patient not Medically ready for discharge.  CM will continue to follow as patient progresses with care towards discharge.     Transition of Care Asessment: Insurance and Status: Insurance coverage has been reviewed Patient has primary care physician: Yes Home environment has been reviewed: Yes Prior level of function:: Independent Prior/Current Home Services: No current home services Social Drivers of Health Review: SDOH reviewed no interventions necessary Readmission risk has been reviewed: Yes Transition of care needs: no transition of care needs at this time

## 2024-07-04 DIAGNOSIS — K760 Fatty (change of) liver, not elsewhere classified: Secondary | ICD-10-CM | POA: Insufficient documentation

## 2024-07-04 DIAGNOSIS — N200 Calculus of kidney: Secondary | ICD-10-CM | POA: Diagnosis not present

## 2024-07-04 DIAGNOSIS — N179 Acute kidney failure, unspecified: Secondary | ICD-10-CM | POA: Diagnosis not present

## 2024-07-04 DIAGNOSIS — D72829 Elevated white blood cell count, unspecified: Secondary | ICD-10-CM

## 2024-07-04 DIAGNOSIS — I10 Essential (primary) hypertension: Secondary | ICD-10-CM | POA: Diagnosis not present

## 2024-07-04 LAB — CBC
HCT: 39.8 % (ref 39.0–52.0)
Hemoglobin: 13.7 g/dL (ref 13.0–17.0)
MCH: 31.3 pg (ref 26.0–34.0)
MCHC: 34.4 g/dL (ref 30.0–36.0)
MCV: 90.9 fL (ref 80.0–100.0)
Platelets: 183 K/uL (ref 150–400)
RBC: 4.38 MIL/uL (ref 4.22–5.81)
RDW: 12.9 % (ref 11.5–15.5)
WBC: 8.3 K/uL (ref 4.0–10.5)
nRBC: 0 % (ref 0.0–0.2)

## 2024-07-04 LAB — BASIC METABOLIC PANEL WITH GFR
Anion gap: 5 (ref 5–15)
BUN: 11 mg/dL (ref 6–20)
CO2: 26 mmol/L (ref 22–32)
Calcium: 8.4 mg/dL — ABNORMAL LOW (ref 8.9–10.3)
Chloride: 107 mmol/L (ref 98–111)
Creatinine, Ser: 1.17 mg/dL (ref 0.61–1.24)
GFR, Estimated: >60 mL/min (ref >60)
Glucose, Bld: 94 mg/dL (ref 70–99)
Potassium: 3.7 mmol/L (ref 3.5–5.1)
Sodium: 138 mmol/L (ref 135–145)

## 2024-07-04 LAB — HIV ANTIBODY (ROUTINE TESTING W REFLEX): HIV Screen 4th Generation wRfx: NONREACTIVE

## 2024-07-04 MED ORDER — SODIUM CHLORIDE 0.9 % IV SOLN: INTRAVENOUS | Status: DC

## 2024-07-04 NOTE — Plan of Care (Signed)
  Problem: Medication: Goal: Compliance with prescribed medication regimen will improve by discharge Outcome: Progressing   Problem: Pain Management: Goal: Satisfaction with pain management regimen will be met by discharge Outcome: Progressing   Problem: Health Behavior/Discharge Planning: Goal: Ability to manage health-related needs will improve Outcome: Progressing   Problem: Clinical Measurements: Goal: Ability to maintain clinical measurements within normal limits will improve Outcome: Progressing Goal: Will remain free from infection Outcome: Progressing Goal: Diagnostic test results will improve Outcome: Progressing Goal: Respiratory complications will improve Outcome: Progressing Goal: Cardiovascular complication will be avoided Outcome: Progressing   Problem: Activity: Goal: Risk for activity intolerance will decrease Outcome: Progressing   Problem: Nutrition: Goal: Adequate nutrition will be maintained Outcome: Progressing   Problem: Coping: Goal: Level of anxiety will decrease Outcome: Progressing   Problem: Elimination: Goal: Will not experience complications related to bowel motility Outcome: Progressing Goal: Will not experience complications related to urinary retention Outcome: Progressing   Problem: Pain Managment: Goal: General experience of comfort will improve and/or be controlled Outcome: Progressing   Problem: Safety: Goal: Ability to remain free from injury will improve Outcome: Progressing   Problem: Skin Integrity: Goal: Risk for impaired skin integrity will decrease Outcome: Progressing

## 2024-07-04 NOTE — Progress Notes (Signed)
 [facility] PROGRESS NOTE    Progress Note  Jimmy Serrano  FMW:[id number] DOB: [birth year] DOA: 07/02/2024 PCP: Suanne Pfeiffer, NP     Brief Narrative:   Jimmy Serrano is an 43 y.o. male past medical history significant for essential hypertension, fatty liver polycythemia Raynaud's presents with left quadrant abdominal pain nausea and vomiting found to have a 3 mm left ureteral stone on CT scan of the abdomen pelvis   Assessment/Plan:   Left ureteral nephrolithiasis 3 mm: UA showed no evidence of infection.  Sepsis was ruled out CT renal protocol showed 3 mm stone. Pain is not controlled continues to be nauseated. Check basic metabolic panel, follow-up on his potassium and creatinine.  Acute kidney injury: Likely hemodynamically mediated start him on IV fluids recheck basic metabolic tomorrow morning. Continue NSAIDs start narcotics.  Leukocytosis Has remained afebrile question reactive. CBC today is pending.  Essential hypertension Initially elevated due to pain. Currently on no antihypertensive medication blood pressure is well-controlled.  Fatty liver by imaging Noted, LFTs are unremarkable   DVT prophylaxis: lovenox  Family Communication:none Status is: Inpatient Remains inpatient appropriate because: Continues to have uncontrolled pain    Code Status:     Code Status Orders  (From admission, onward)           Start     Ordered   07/03/24 0017  Full code  Continuous       Question:  By:  Answer:  Consent: discussion documented in EHR   07/03/24 0020           Code Status History     Date Active Date Inactive Code Status Order ID Comments User Context   06/23/2023 2106 06/25/2023 1552 Full Code [id number]  Moody Alto, MD Inpatient         IV Access:   Peripheral IV   Procedures and diagnostic studies:   CT Renal Stone Study Result Date: 07/02/2024 CLINICAL DATA:  Left-sided flank pain. Severe left abdominal pain  progressively worsening. Microscopic hemoglobin in the urine. History of kidney stones. EXAM: CT ABDOMEN AND PELVIS WITHOUT CONTRAST TECHNIQUE: Multidetector CT imaging of the abdomen and pelvis was performed following the standard protocol without IV contrast. RADIATION DOSE REDUCTION: This exam was performed according to the departmental dose-optimization program which includes automated exposure control, adjustment of the mA and/or kV according to patient size and/or use of iterative reconstruction technique. COMPARISON:  CT chest abdomen pelvis 06/24/2024 FINDINGS: Lower chest: Lung bases are clear. Hepatobiliary: No focal liver abnormality is seen. Status post cholecystectomy. No biliary dilatation. Pancreas: Unremarkable. No pancreatic ductal dilatation or surrounding inflammatory changes. Spleen: Normal in size without focal abnormality. Adrenals/Urinary Tract: No adrenal gland nodules. 3 mm stone in the proximal left ureter at the ureteropelvic junction. Mild left hydronephrosis. Distal ureter is decompressed. Mild stranding around the left kidney. Right kidney, right ureter, and the bladder are unremarkable. Stomach/Bowel: Stomach is within normal limits. Appendix appears normal. No evidence of bowel wall thickening, distention, or inflammatory changes. Vascular/Lymphatic: No significant vascular findings are present. No enlarged abdominal or pelvic lymph nodes. Reproductive: Prostate is unremarkable. Other: No abdominal wall hernia or abnormality. No abdominopelvic ascites. Musculoskeletal: No acute or significant osseous findings. IMPRESSION: 1. 3 mm stone in the proximal left ureter with moderate proximal obstruction. Electronically Signed   By: Elsie Gravely M.D.   On: 07/02/2024 19:46     Medical Consultants:   None.   Subjective:    Jimmy Serrano relates still having pain  and nauseated.  Objective:    Vitals:   07/03/24 1607 07/03/24 2127 07/04/24 0400 07/04/24 0453  BP:  112/75 118/75  119/81  Pulse: 71 79  82  Resp: 18   20  Temp: 98 F (36.7 C) 98.2 F (36.8 C)  97.9 F (36.6 C)  TempSrc: Oral Oral  Oral  SpO2: 97% 97%  95%  Weight:   104.1 kg   Height:       SpO2: 95 %   Intake/Output Summary (Last 24 hours) at 07/04/2024 0805 Last data filed at 07/04/2024 0600 Gross per 24 hour  Intake 3868.54 ml  Output 0 ml  Net 3868.54 ml   Filed Weights   07/02/24 1758 07/04/24 0400  Weight: 102.1 kg 104.1 kg    Exam: General exam: In no acute distress. Respiratory system: Good air movement and clear to auscultation. Cardiovascular system: S1 & S2 heard, RRR. No JVD. Gastrointestinal system: Abdomen is nondistended, soft and nontender.  Extremities: No pedal edema. Skin: No rashes, lesions or ulcers Psychiatry: Judgement and insight appear normal. Mood & affect appropriate.    Data Reviewed:    Labs: Basic Metabolic Panel: Recent Labs  Lab 07/02/24 1802 07/02/24 1829 07/03/24 0509  NA 139 143 137  K 3.8 3.9 5.0  CL 107 108 106  CO2 22  --  22  GLUCOSE 92 95 115*  BUN 15 17 17   CREATININE 1.33* 1.30* 1.40*  CALCIUM 9.0  --  8.4*   GFR Estimated Creatinine Clearance: 83.1 mL/min (A) (by C-G formula based on SCr of 1.4 mg/dL (H)). Liver Function Tests: Recent Labs  Lab 07/02/24 1802  AST 25  ALT 27  ALKPHOS 68  BILITOT 1.0  PROT 7.9  ALBUMIN 4.5   Recent Labs  Lab 07/02/24 1802  LIPASE 39   No results for input(s): AMMONIA in the last 168 hours. Coagulation profile No results for input(s): INR, PROTIME in the last 168 hours. COVID-19 Labs  Recent Labs    07/03/24 0124  CRP 0.5    No results found for: SARSCOV2NAA  CBC: Recent Labs  Lab 07/02/24 1802 07/02/24 1829 07/03/24 0510  WBC 12.7*  --  11.9*  NEUTROABS 9.8*  --   --   HGB 16.7 16.7 14.6  HCT 47.6 49.0 42.3  MCV 88.6  --  89.6  PLT 248  --  213   Cardiac Enzymes: No results for input(s): CKTOTAL, CKMB, CKMBINDEX, TROPONINI in  the last 168 hours. BNP (last 3 results) No results for input(s): PROBNP in the last 8760 hours. CBG: No results for input(s): GLUCAP in the last 168 hours. D-Dimer: No results for input(s): DDIMER in the last 72 hours. Hgb A1c: No results for input(s): HGBA1C in the last 72 hours. Lipid Profile: No results for input(s): CHOL, HDL, LDLCALC, TRIG, CHOLHDL, LDLDIRECT in the last 72 hours. Thyroid function studies: No results for input(s): TSH, T4TOTAL, T3FREE, THYROIDAB in the last 72 hours.  Invalid input(s): FREET3 Anemia work up: No results for input(s): VITAMINB12, FOLATE, FERRITIN, TIBC, IRON, RETICCTPCT in the last 72 hours. Sepsis Labs: Recent Labs  Lab 07/02/24 1802 07/03/24 0124 07/03/24 0510  WBC 12.7*  --  11.9*  LATICACIDVEN  --  2.0* 2.2*   Microbiology No results found for this or any previous visit (from the past 240 hours).   Medications:    tamsulosin   0.4 mg Oral Daily   Continuous Infusions:    LOS: 2 days   Jimmy Serrano  [facility]  07/04/2024, 8:05 AM

## 2024-07-04 NOTE — Plan of Care (Signed)
   Problem: Health Behavior/Discharge Planning: Goal: Ability to manage health-related needs will improve Outcome: Completed/Met

## 2024-07-04 NOTE — Plan of Care (Signed)
  Problem: Medication: Goal: Compliance with prescribed medication regimen will improve by discharge 07/04/2024 1900 by Vinie Leotis CROME, RN Outcome: Progressing 07/04/2024 1900 by Vinie Leotis CROME, RN Outcome: Progressing   Problem: Pain Management: Goal: Satisfaction with pain management regimen will be met by discharge 07/04/2024 1900 by Vinie Leotis CROME, RN Outcome: Progressing 07/04/2024 1900 by Vinie Leotis CROME, RN Outcome: Progressing   Problem: Health Behavior/Discharge Planning: Goal: Ability to manage health-related needs will improve 07/04/2024 1900 by Vinie Leotis CROME, RN Outcome: Progressing 07/04/2024 1900 by Vinie Leotis CROME, RN Outcome: Progressing   Problem: Clinical Measurements: Goal: Ability to maintain clinical measurements within normal limits will improve 07/04/2024 1900 by Vinie Leotis CROME, RN Outcome: Progressing 07/04/2024 1900 by Vinie Leotis CROME, RN Outcome: Progressing Goal: Will remain free from infection 07/04/2024 1900 by Vinie Leotis CROME, RN Outcome: Progressing 07/04/2024 1900 by Vinie Leotis CROME, RN Outcome: Progressing Goal: Diagnostic test results will improve 07/04/2024 1900 by Vinie Leotis CROME, RN Outcome: Progressing 07/04/2024 1900 by Vinie Leotis CROME, RN Outcome: Progressing Goal: Respiratory complications will improve 07/04/2024 1900 by Vinie Leotis CROME, RN Outcome: Progressing 07/04/2024 1900 by Vinie Leotis CROME, RN Outcome: Progressing Goal: Cardiovascular complication will be avoided 07/04/2024 1900 by Vinie Leotis CROME, RN Outcome: Progressing 07/04/2024 1900 by Vinie Leotis CROME, RN Outcome: Progressing   Problem: Activity: Goal: Risk for activity intolerance will decrease 07/04/2024 1900 by Vinie Leotis CROME, RN Outcome: Progressing 07/04/2024 1900 by Vinie Leotis CROME, RN Outcome: Progressing   Problem: Nutrition: Goal: Adequate nutrition will be maintained 07/04/2024 1900 by Vinie Leotis CROME, RN Outcome: Progressing 07/04/2024 1900 by Vinie Leotis CROME, RN Outcome: Progressing   Problem: Coping: Goal: Level of anxiety will decrease 07/04/2024 1900 by Vinie Leotis CROME, RN Outcome: Progressing 07/04/2024 1900 by Vinie Leotis CROME, RN Outcome: Progressing   Problem: Elimination: Goal: Will not experience complications related to bowel motility 07/04/2024 1900 by Vinie Leotis CROME, RN Outcome: Progressing 07/04/2024 1900 by Vinie Leotis CROME, RN Outcome: Progressing Goal: Will not experience complications related to urinary retention 07/04/2024 1900 by Vinie Leotis CROME, RN Outcome: Progressing 07/04/2024 1900 by Vinie Leotis CROME, RN Outcome: Progressing   Problem: Pain Managment: Goal: General experience of comfort will improve and/or be controlled 07/04/2024 1900 by Vinie Leotis CROME, RN Outcome: Progressing 07/04/2024 1900 by Vinie Leotis CROME, RN Outcome: Progressing   Problem: Safety: Goal: Ability to remain free from injury will improve 07/04/2024 1900 by Vinie Leotis CROME, RN Outcome: Progressing 07/04/2024 1900 by Vinie Leotis CROME, RN Outcome: Progressing   Problem: Skin Integrity: Goal: Risk for impaired skin integrity will decrease 07/04/2024 1900 by Vinie Leotis CROME, RN Outcome: Progressing 07/04/2024 1900 by Vinie Leotis CROME, RN Outcome: Progressing

## 2024-07-04 NOTE — Plan of Care (Signed)
 Problem: Medication: Goal: Compliance with prescribed medication regimen will improve by discharge 07/04/2024 1900 by Vinie Leotis CROME, RN Outcome: Progressing 07/04/2024 1900 by Vinie Leotis CROME, RN Outcome: Progressing 07/04/2024 1900 by Vinie Leotis CROME, RN Outcome: Progressing   Problem: Pain Management: Goal: Satisfaction with pain management regimen will be met by discharge 07/04/2024 1900 by Vinie Leotis CROME, RN Outcome: Progressing 07/04/2024 1900 by Vinie Leotis CROME, RN Outcome: Progressing 07/04/2024 1900 by Vinie Leotis CROME, RN Outcome: Progressing   Problem: Health Behavior/Discharge Planning: Goal: Ability to manage health-related needs will improve 07/04/2024 1900 by Vinie Leotis CROME, RN Outcome: Progressing 07/04/2024 1900 by Vinie Leotis CROME, RN Outcome: Progressing 07/04/2024 1900 by Vinie Leotis CROME, RN Outcome: Progressing   Problem: Clinical Measurements: Goal: Ability to maintain clinical measurements within normal limits will improve 07/04/2024 1900 by Vinie Leotis CROME, RN Outcome: Progressing 07/04/2024 1900 by Vinie Leotis CROME, RN Outcome: Progressing 07/04/2024 1900 by Vinie Leotis CROME, RN Outcome: Progressing Goal: Will remain free from infection 07/04/2024 1900 by Vinie Leotis CROME, RN Outcome: Progressing 07/04/2024 1900 by Vinie Leotis CROME, RN Outcome: Progressing 07/04/2024 1900 by Vinie Leotis CROME, RN Outcome: Progressing Goal: Diagnostic test results will improve 07/04/2024 1900 by Vinie Leotis CROME, RN Outcome: Progressing 07/04/2024 1900 by Vinie Leotis CROME, RN Outcome: Progressing 07/04/2024 1900 by Vinie Leotis CROME, RN Outcome: Progressing Goal: Respiratory complications will improve 07/04/2024 1900 by Vinie Leotis CROME, RN Outcome: Progressing 07/04/2024 1900 by Vinie Leotis CROME, RN Outcome: Progressing 07/04/2024 1900 by Vinie Leotis CROME, RN Outcome: Progressing Goal: Cardiovascular complication will be avoided 07/04/2024 1900 by Vinie Leotis CROME, RN Outcome:  Progressing 07/04/2024 1900 by Vinie Leotis CROME, RN Outcome: Progressing 07/04/2024 1900 by Vinie Leotis CROME, RN Outcome: Progressing   Problem: Activity: Goal: Risk for activity intolerance will decrease 07/04/2024 1900 by Vinie Leotis CROME, RN Outcome: Progressing 07/04/2024 1900 by Vinie Leotis CROME, RN Outcome: Progressing 07/04/2024 1900 by Vinie Leotis CROME, RN Outcome: Progressing   Problem: Nutrition: Goal: Adequate nutrition will be maintained 07/04/2024 1900 by Vinie Leotis CROME, RN Outcome: Progressing 07/04/2024 1900 by Vinie Leotis CROME, RN Outcome: Progressing 07/04/2024 1900 by Vinie Leotis CROME, RN Outcome: Progressing   Problem: Coping: Goal: Level of anxiety will decrease 07/04/2024 1900 by Vinie Leotis CROME, RN Outcome: Progressing 07/04/2024 1900 by Vinie Leotis CROME, RN Outcome: Progressing 07/04/2024 1900 by Vinie Leotis CROME, RN Outcome: Progressing   Problem: Elimination: Goal: Will not experience complications related to bowel motility 07/04/2024 1900 by Vinie Leotis CROME, RN Outcome: Progressing 07/04/2024 1900 by Vinie Leotis CROME, RN Outcome: Progressing 07/04/2024 1900 by Vinie Leotis CROME, RN Outcome: Progressing Goal: Will not experience complications related to urinary retention 07/04/2024 1900 by Vinie Leotis CROME, RN Outcome: Progressing 07/04/2024 1900 by Vinie Leotis CROME, RN Outcome: Progressing 07/04/2024 1900 by Vinie Leotis CROME, RN Outcome: Progressing   Problem: Pain Managment: Goal: General experience of comfort will improve and/or be controlled 07/04/2024 1900 by Vinie Leotis CROME, RN Outcome: Progressing 07/04/2024 1900 by Vinie Leotis CROME, RN Outcome: Progressing 07/04/2024 1900 by Vinie Leotis CROME, RN Outcome: Progressing   Problem: Safety: Goal: Ability to remain free from injury will improve 07/04/2024 1900 by Vinie Leotis CROME, RN Outcome: Progressing 07/04/2024 1900 by Vinie Leotis CROME, RN Outcome: Progressing 07/04/2024 1900 by Vinie Leotis CROME, RN Outcome:  Progressing   Problem: Skin Integrity: Goal: Risk for impaired skin integrity will decrease 07/04/2024 1900 by Vinie Leotis CROME, RN Outcome: Progressing 07/04/2024 1900 by Vinie Leotis CROME, RN Outcome: Progressing  07/04/2024 1900 by Vinie Leotis CROME, RN Outcome: Progressing

## 2024-07-05 ENCOUNTER — Other Ambulatory Visit (HOSPITAL_COMMUNITY): Payer: Self-pay

## 2024-07-05 DIAGNOSIS — N200 Calculus of kidney: Secondary | ICD-10-CM | POA: Diagnosis not present

## 2024-07-05 LAB — BASIC METABOLIC PANEL WITH GFR
Anion gap: 6 (ref 5–15)
BUN: 6 mg/dL (ref 6–20)
CO2: 25 mmol/L (ref 22–32)
Calcium: 8.4 mg/dL — ABNORMAL LOW (ref 8.9–10.3)
Chloride: 109 mmol/L (ref 98–111)
Creatinine, Ser: 1.09 mg/dL (ref 0.61–1.24)
GFR, Estimated: >60 mL/min (ref >60)
Glucose, Bld: 97 mg/dL (ref 70–99)
Potassium: 3.4 mmol/L — ABNORMAL LOW (ref 3.5–5.1)
Sodium: 140 mmol/L (ref 135–145)

## 2024-07-05 LAB — CBC
HCT: 37.4 % — ABNORMAL LOW (ref 39.0–52.0)
Hemoglobin: 13.3 g/dL (ref 13.0–17.0)
MCH: 31.1 pg (ref 26.0–34.0)
MCHC: 35.6 g/dL (ref 30.0–36.0)
MCV: 87.6 fL (ref 80.0–100.0)
Platelets: 173 K/uL (ref 150–400)
RBC: 4.27 MIL/uL (ref 4.22–5.81)
RDW: 12.6 % (ref 11.5–15.5)
WBC: 7.5 K/uL (ref 4.0–10.5)
nRBC: 0 % (ref 0.0–0.2)

## 2024-07-05 MED ORDER — ONDANSETRON 8 MG PO TBDP
8.0000 mg | ORAL_TABLET | Freq: Three times a day (TID) | ORAL | 0 refills | Status: AC
  Filled 2024-07-05: qty 20, 7d supply, fill #0

## 2024-07-05 MED ORDER — POTASSIUM CHLORIDE CRYS ER 20 MEQ PO TBCR
40.0000 meq | EXTENDED_RELEASE_TABLET | ORAL | Status: AC
  Administered 2024-07-05 (×2): 40 meq via ORAL
  Filled 2024-07-05 (×2): qty 2

## 2024-07-05 NOTE — H&P (Signed)
 Physician Discharge Summary  Jimmy Serrano [facility] FMW:[id number] DOB: [birth year] DOA: 07/02/2024  PCP: Suanne Pfeiffer, NP  Admit date: 07/02/2024 Discharge date: 07/05/2024  Admitted From:Home Disposition:  Home  Recommendations for Outpatient Follow-up:  Follow up with Urology in 1-2 weeks Please obtain BMP/CBC in one week   Home Health:No Equipment/Devices:None  Discharge Condition:Stable CODE STATUS:Full Diet recommendation: Heart Healthy  Brief/Interim Summary: 43 y.o. male past medical history significant for essential hypertension, fatty liver polycythemia Raynaud's presents with left quadrant abdominal pain nausea and vomiting found to have a 3 mm left ureteral stone on CT scan of the abdomen pelvis   Discharge Diagnoses:  Principal Problem:   Renal stone Active Problems:   Leukocytosis   Essential hypertension   Fatty liver Left ureteral nephrolithiasis 3 mm, UA showed no evidence of infection sepsis was ruled out. CT renal protocol showed 3 mm stone. He was started on IV fluids narcotics and Flomax . His creatinine returned to baseline. He will follow-up with urology as an outpatient.  Acute kidney injury: Likely hemodynamic mediated NSAIDs.  He was started on IV fluids his creatinine returned to baseline.  Leukocytosis: He remained afebrile likely reactive.  Elevated blood pressure without a diagnosis of hypertension: Once his pain control his blood pressure improved.  Fatty liver by imaging: Noted.   Discharge Instructions  Discharge Instructions     Diet - low sodium heart healthy   Complete by: As directed    Increase activity slowly   Complete by: As directed       Allergies as of 07/05/2024   No Known Allergies      Medication List     STOP taking these medications    ciprofloxacin 500 MG tablet Commonly known as: CIPRO       TAKE these medications    HYDROcodone -acetaminophen  5-325 MG tablet Commonly known as:  NORCO/VICODIN Take 1 tablet by mouth every 4 (four) hours as needed.   ibuprofen  200 MG tablet Commonly known as: ADVIL  Take 600 mg by mouth as needed for headache.   ondansetron  8 MG disintegrating tablet Commonly known as: ZOFRAN -ODT Take 1 tablet (8 mg total) by mouth 3 (three) times daily.   tamsulosin  0.4 MG Caps capsule Commonly known as: FLOMAX  Take 0.4 mg by mouth daily.   triamcinolone ointment 0.5 % Commonly known as: KENALOG Apply 1 Application topically 2 (two) times daily as needed (skin irritation).        No Known Allergies  Consultations: None   Procedures/Studies: CT Renal Stone Study Result Date: 07/02/2024 CLINICAL DATA:  Left-sided flank pain. Severe left abdominal pain progressively worsening. Microscopic hemoglobin in the urine. History of kidney stones. EXAM: CT ABDOMEN AND PELVIS WITHOUT CONTRAST TECHNIQUE: Multidetector CT imaging of the abdomen and pelvis was performed following the standard protocol without IV contrast. RADIATION DOSE REDUCTION: This exam was performed according to the departmental dose-optimization program which includes automated exposure control, adjustment of the mA and/or kV according to patient size and/or use of iterative reconstruction technique. COMPARISON:  CT chest abdomen pelvis 06/24/2024 FINDINGS: Lower chest: Lung bases are clear. Hepatobiliary: No focal liver abnormality is seen. Status post cholecystectomy. No biliary dilatation. Pancreas: Unremarkable. No pancreatic ductal dilatation or surrounding inflammatory changes. Spleen: Normal in size without focal abnormality. Adrenals/Urinary Tract: No adrenal gland nodules. 3 mm stone in the proximal left ureter at the ureteropelvic junction. Mild left hydronephrosis. Distal ureter is decompressed. Mild stranding around the left kidney. Right kidney, right ureter, and the bladder are unremarkable. Stomach/Bowel:  Stomach is within normal limits. Appendix appears normal. No evidence  of bowel wall thickening, distention, or inflammatory changes. Vascular/Lymphatic: No significant vascular findings are present. No enlarged abdominal or pelvic lymph nodes. Reproductive: Prostate is unremarkable. Other: No abdominal wall hernia or abnormality. No abdominopelvic ascites. Musculoskeletal: No acute or significant osseous findings. IMPRESSION: 1. 3 mm stone in the proximal left ureter with moderate proximal obstruction. Electronically Signed   By: Elsie Gravely M.D.   On: 07/02/2024 19:46   CT Angio Chest/Abd/Pel for Dissection W and/or Wo Contrast Result Date: 06/24/2024 CLINICAL DATA:  Left arm weakness, chest pain, nausea EXAM: CT ANGIOGRAPHY CHEST, ABDOMEN AND PELVIS TECHNIQUE: Non-contrast CT of the chest was initially obtained. Multidetector CT imaging through the chest, abdomen and pelvis was performed using the standard protocol during bolus administration of intravenous contrast. Multiplanar reconstructed images and MIPs were obtained and reviewed to evaluate the vascular anatomy. RADIATION DOSE REDUCTION: This exam was performed according to the departmental dose-optimization program which includes automated exposure control, adjustment of the mA and/or kV according to patient size and/or use of iterative reconstruction technique. CONTRAST:  OMNIPAQUE  IOHEXOL  350 MG/ML SOLN COMPARISON:  CT 06/23/2023 and previous FINDINGS: CTA CHEST FINDINGS Cardiovascular: Heart size normal. No pericardial effusion. Satisfactory opacification of pulmonary arteries noted, and there is no evidence of pulmonary emboli. Adequate contrast opacification of the thoracic aorta with no evidence of dissection, aneurysm, or stenosis. There is bovine variant brachiocephalic arch anatomy without proximal stenosis. Mediastinum/Nodes: No mass, hematoma, or adenopathy. Lungs/Pleura: No pleural effusion. No pneumothorax. Minimal dependent atelectasis in the lower lobes. Lungs otherwise clear. Musculoskeletal: No  chest wall abnormality. No acute or significant osseous findings. Review of the MIP images confirms the above findings. CTA ABDOMEN AND PELVIS FINDINGS VASCULAR Aorta: Normal caliber aorta without aneurysm, dissection, vasculitis or significant stenosis. Celiac: Patent without evidence of aneurysm, dissection, vasculitis or significant stenosis. SMA: Patent without evidence of aneurysm, dissection, vasculitis or significant stenosis. Renals: Both renal arteries are patent without evidence of aneurysm, dissection, vasculitis, fibromuscular dysplasia or significant stenosis. IMA: Patent without evidence of aneurysm, dissection, vasculitis or significant stenosis. Inflow: Patent without evidence of aneurysm, dissection, vasculitis or significant stenosis. Veins: No obvious venous abnormality within the limitations of this arterial phase study. Review of the MIP images confirms the above findings. NON-VASCULAR Hepatobiliary: No focal liver abnormality is seen. Status post cholecystectomy. No biliary dilatation. Pancreas: Unremarkable. No pancreatic ductal dilatation or surrounding inflammatory changes. Spleen: Normal in size without focal abnormality. Adrenals/Urinary Tract: Adrenal glands are unremarkable. Kidneys are normal, without renal calculi, focal lesion, or hydronephrosis. Bladder is unremarkable. Stomach/Bowel: Stomach is within normal limits. Appendix appears normal. No evidence of bowel wall thickening, distention, or inflammatory changes. Lymphatic: No abdominal or pelvic adenopathy. Reproductive: Mild prostate enlargement with central coarse calcifications. Other: No ascites.  No free air. Musculoskeletal: No acute or significant osseous findings. Review of the MIP images confirms the above findings. IMPRESSION: 1. Negative for acute PE or thoracic aortic dissection. 2. No acute findings. 3. Mild prostate enlargement. Electronically Signed   By: JONETTA Faes M.D.   On: 06/24/2024 10:23   (Echo, Carotid,  EGD, Colonoscopy, ERCP)    Subjective: No complaints  Discharge Exam: Vitals:   07/04/24 2044 07/05/24 0447  BP: 129/88 (!) 126/93  Pulse: 93 74  Resp: 18 18  Temp: 98.3 F (36.8 C) 98.3 F (36.8 C)  SpO2: 95% 97%   Vitals:   07/04/24 1538 07/04/24 1637 07/04/24 2044 07/05/24 0447  BP: 95/60 (!) 142/87 129/88 (!) 126/93  Pulse: 83 85 93 74  Resp:  18 18 18   Temp: 98.1 F (36.7 C) 98.6 F (37 C) 98.3 F (36.8 C) 98.3 F (36.8 C)  TempSrc: Oral Oral    SpO2: 100% 100% 95% 97%  Weight:      Height:        General: Pt is alert, awake, not in acute distress Cardiovascular: RRR, S1/S2 +, no rubs, no gallops Respiratory: CTA bilaterally, no wheezing, no rhonchi Abdominal: Soft, NT, ND, bowel sounds + Extremities: no edema, no cyanosis    The results of significant diagnostics from this hospitalization (including imaging, microbiology, ancillary and laboratory) are listed below for reference.     Microbiology: No results found for this or any previous visit (from the past 240 hours).   Labs: BNP (last 3 results) No results for input(s): BNP in the last 8760 hours. Basic Metabolic Panel: Recent Labs  Lab 07/02/24 1802 07/02/24 1829 07/03/24 0509 07/04/24 1032 07/05/24 0533  NA 139 143 137 138 140  K 3.8 3.9 5.0 3.7 3.4*  CL 107 108 106 107 109  CO2 22  --  22 26 25   GLUCOSE 92 95 115* 94 97  BUN 15 17 17 11 6   CREATININE 1.33* 1.30* 1.40* 1.17 1.09  CALCIUM 9.0  --  8.4* 8.4* 8.4*   Liver Function Tests: Recent Labs  Lab 07/02/24 1802  AST 25  ALT 27  ALKPHOS 68  BILITOT 1.0  PROT 7.9  ALBUMIN 4.5   Recent Labs  Lab 07/02/24 1802  LIPASE 39   No results for input(s): AMMONIA in the last 168 hours. CBC: Recent Labs  Lab 07/02/24 1802 07/02/24 1829 07/03/24 0510 07/04/24 1032 07/05/24 0533  WBC 12.7*  --  11.9* 8.3 7.5  NEUTROABS 9.8*  --   --   --   --   HGB 16.7 16.7 14.6 13.7 13.3  HCT 47.6 49.0 42.3 39.8 37.4*  MCV 88.6  --   89.6 90.9 87.6  PLT 248  --  213 183 173   Cardiac Enzymes: No results for input(s): CKTOTAL, CKMB, CKMBINDEX, TROPONINI in the last 168 hours. BNP: Invalid input(s): POCBNP CBG: No results for input(s): GLUCAP in the last 168 hours. D-Dimer No results for input(s): DDIMER in the last 72 hours. Hgb A1c No results for input(s): HGBA1C in the last 72 hours. Lipid Profile No results for input(s): CHOL, HDL, LDLCALC, TRIG, CHOLHDL, LDLDIRECT in the last 72 hours. Thyroid function studies No results for input(s): TSH, T4TOTAL, T3FREE, THYROIDAB in the last 72 hours.  Invalid input(s): FREET3 Anemia work up No results for input(s): VITAMINB12, FOLATE, FERRITIN, TIBC, IRON, RETICCTPCT in the last 72 hours. Urinalysis    Component Value Date/Time   COLORURINE YELLOW 07/02/2024 1802   APPEARANCEUR HAZY (A) 07/02/2024 1802   LABSPEC 1.028 07/02/2024 1802   PHURINE 5.0 07/02/2024 1802   GLUCOSEU NEGATIVE 07/02/2024 1802   HGBUR LARGE (A) 07/02/2024 1802   BILIRUBINUR NEGATIVE 07/02/2024 1802   BILIRUBINUR negative 07/07/2023 1347   KETONESUR NEGATIVE 07/02/2024 1802   PROTEINUR 30 (A) 07/02/2024 1802   UROBILINOGEN 0.2 07/07/2023 1347   UROBILINOGEN 0.2 04/08/2011 1107   NITRITE NEGATIVE 07/02/2024 1802   LEUKOCYTESUR NEGATIVE 07/02/2024 1802   Sepsis Labs Recent Labs  Lab 07/02/24 1802 07/03/24 0510 07/04/24 1032 07/05/24 0533  WBC 12.7* 11.9* 8.3 7.5   Microbiology No results found for this or any previous visit (from the past  240 hours).   Time coordinating discharge: Over 35 minutes  SIGNED:   Erle Odell Castor, MD  [facility] 07/05/2024, 8:23 AM Pager   If 7PM-7AM, please contact night-coverage [email or web address] Password [email or web address]

## 2024-07-05 NOTE — TOC Transition Note (Signed)
 Transition of Care [facility]) - Discharge Note   Patient Details  Name: Jimmy Serrano MRN: [id number] Date of Birth: 1981/07/19  [facility] CM/SW Contact:  Tom-Johnson, Harvest Muskrat, RN Phone Number: 07/05/2024, 9:11 AM   Clinical Narrative:     Patient is scheduled for discharge today.  Readmission Risk Assessment done. Hospital f/u and discharge instructions on AVS. Prescriptions sent to [facility] pharmacy and patient will receive meds prior discharge. No TOC needs or recommendations noted. Wife, Randall to transport at discharge.  No further TOC needs noted.       Final next level of care: Home/Self Care Barriers to Discharge: Barriers Resolved   Patient Goals and CMS Choice Patient states their goals for this hospitalization and ongoing recovery are:: Too return home CMS Medicare.gov Compare Post Acute Care list provided to:: Patient Choice offered to / list presented to : NA      Discharge Placement                Patient to be transferred to facility by: Wife Name of family member notified: Randall    Discharge Plan and Services Additional resources added to the After Visit Summary for                  DME Arranged: N/A DME Agency: NA       HH Arranged: NA HH Agency: NA        Social Drivers of Health (SDOH) Interventions SDOH Screenings   Food Insecurity: No Food Insecurity (07/03/2024)  Housing: Low Risk  (07/03/2024)  Transportation Needs: No Transportation Needs (07/03/2024)  Utilities: Not At Risk (07/03/2024)  Financial Resource Strain: Low Risk  (11/08/2023)   Received from [facility]  Physical Activity: Unknown (11/08/2023)   Received from [facility]  Social Connections: Socially Integrated (11/08/2023)   Received from [facility]  Stress: Stress Concern Present (11/08/2023)   Received from [facility]  Tobacco Use: Low Risk  (07/02/2024)     Readmission Risk Interventions    07/03/2024   11:26 AM 06/24/2023   11:45  AM  Readmission Risk Prevention Plan  Post Dischage Appt Complete Complete  Medication Screening Complete Complete  Transportation Screening Complete Complete

## 2024-07-06 NOTE — Discharge Summary (Signed)
 Physician Discharge Summary  Jimmy Serrano [facility] FMW:[id number] DOB: 09/19/1981 DOA: 07/02/2024   PCP: Suanne Pfeiffer, NP   Admit date: 07/02/2024 Discharge date: 07/05/2024   Admitted From:Home Disposition:  Home   Recommendations for Outpatient Follow-up:  Follow up with Urology in 1-2 weeks Please obtain BMP/CBC in one week     Home Health:No Equipment/Devices:None   Discharge Condition:Stable CODE STATUS:Full Diet recommendation: Heart Healthy   Brief/Interim Summary: 43 y.o. male past medical history significant for essential hypertension, fatty liver polycythemia Raynaud's presents with left quadrant abdominal pain nausea and vomiting found to have a 3 mm left ureteral stone on CT scan of the abdomen pelvis    Discharge Diagnoses:  Principal Problem:   Renal stone Active Problems:   Leukocytosis   Essential hypertension   Fatty liver Left ureteral nephrolithiasis 3 mm, UA showed no evidence of infection sepsis was ruled out. CT renal protocol showed 3 mm stone. He was started on IV fluids narcotics and Flomax . His creatinine returned to baseline. He will follow-up with urology as an outpatient.   Acute kidney injury: Likely hemodynamic mediated NSAIDs.  He was started on IV fluids his creatinine returned to baseline.   Leukocytosis: He remained afebrile likely reactive.   Elevated blood pressure without a diagnosis of hypertension: Once his pain control his blood pressure improved.   Fatty liver by imaging: Noted.     Discharge Instructions   Discharge Instructions       Diet - low sodium heart healthy   Complete by: As directed      Increase activity slowly   Complete by: As directed           Allergies as of 07/05/2024   No Known Allergies         Medication List       STOP taking these medications     ciprofloxacin 500 MG tablet Commonly known as: CIPRO           TAKE these medications     HYDROcodone -acetaminophen  5-325 MG  tablet Commonly known as: NORCO/VICODIN Take 1 tablet by mouth every 4 (four) hours as needed.    ibuprofen  200 MG tablet Commonly known as: ADVIL  Take 600 mg by mouth as needed for headache.    ondansetron  8 MG disintegrating tablet Commonly known as: ZOFRAN -ODT Take 1 tablet (8 mg total) by mouth 3 (three) times daily.    tamsulosin  0.4 MG Caps capsule Commonly known as: FLOMAX  Take 0.4 mg by mouth daily.    triamcinolone ointment 0.5 % Commonly known as: KENALOG Apply 1 Application topically 2 (two) times daily as needed (skin irritation).             Allergies  No Known Allergies     Consultations: None     Procedures/Studies: Imaging Results  CT Renal Stone Study Result Date: 07/02/2024 CLINICAL DATA:  Left-sided flank pain. Severe left abdominal pain progressively worsening. Microscopic hemoglobin in the urine. History of kidney stones. EXAM: CT ABDOMEN AND PELVIS WITHOUT CONTRAST TECHNIQUE: Multidetector CT imaging of the abdomen and pelvis was performed following the standard protocol without IV contrast. RADIATION DOSE REDUCTION: This exam was performed according to the departmental dose-optimization program which includes automated exposure control, adjustment of the mA and/or kV according to patient size and/or use of iterative reconstruction technique. COMPARISON:  CT chest abdomen pelvis 06/24/2024 FINDINGS: Lower chest: Lung bases are clear. Hepatobiliary: No focal liver abnormality is seen. Status post cholecystectomy. No biliary dilatation. Pancreas: Unremarkable. No pancreatic  ductal dilatation or surrounding inflammatory changes. Spleen: Normal in size without focal abnormality. Adrenals/Urinary Tract: No adrenal gland nodules. 3 mm stone in the proximal left ureter at the ureteropelvic junction. Mild left hydronephrosis. Distal ureter is decompressed. Mild stranding around the left kidney. Right kidney, right ureter, and the bladder are unremarkable.  Stomach/Bowel: Stomach is within normal limits. Appendix appears normal. No evidence of bowel wall thickening, distention, or inflammatory changes. Vascular/Lymphatic: No significant vascular findings are present. No enlarged abdominal or pelvic lymph nodes. Reproductive: Prostate is unremarkable. Other: No abdominal wall hernia or abnormality. No abdominopelvic ascites. Musculoskeletal: No acute or significant osseous findings. IMPRESSION: 1. 3 mm stone in the proximal left ureter with moderate proximal obstruction. Electronically Signed   By: Elsie Gravely M.D.   On: 07/02/2024 19:46    CT Angio Chest/Abd/Pel for Dissection W and/or Wo Contrast Result Date: 06/24/2024 CLINICAL DATA:  Left arm weakness, chest pain, nausea EXAM: CT ANGIOGRAPHY CHEST, ABDOMEN AND PELVIS TECHNIQUE: Non-contrast CT of the chest was initially obtained. Multidetector CT imaging through the chest, abdomen and pelvis was performed using the standard protocol during bolus administration of intravenous contrast. Multiplanar reconstructed images and MIPs were obtained and reviewed to evaluate the vascular anatomy. RADIATION DOSE REDUCTION: This exam was performed according to the departmental dose-optimization program which includes automated exposure control, adjustment of the mA and/or kV according to patient size and/or use of iterative reconstruction technique. CONTRAST:  OMNIPAQUE  IOHEXOL  350 MG/ML SOLN COMPARISON:  CT 06/23/2023 and previous FINDINGS: CTA CHEST FINDINGS Cardiovascular: Heart size normal. No pericardial effusion. Satisfactory opacification of pulmonary arteries noted, and there is no evidence of pulmonary emboli. Adequate contrast opacification of the thoracic aorta with no evidence of dissection, aneurysm, or stenosis. There is bovine variant brachiocephalic arch anatomy without proximal stenosis. Mediastinum/Nodes: No mass, hematoma, or adenopathy. Lungs/Pleura: No pleural effusion. No pneumothorax. Minimal  dependent atelectasis in the lower lobes. Lungs otherwise clear. Musculoskeletal: No chest wall abnormality. No acute or significant osseous findings. Review of the MIP images confirms the above findings. CTA ABDOMEN AND PELVIS FINDINGS VASCULAR Aorta: Normal caliber aorta without aneurysm, dissection, vasculitis or significant stenosis. Celiac: Patent without evidence of aneurysm, dissection, vasculitis or significant stenosis. SMA: Patent without evidence of aneurysm, dissection, vasculitis or significant stenosis. Renals: Both renal arteries are patent without evidence of aneurysm, dissection, vasculitis, fibromuscular dysplasia or significant stenosis. IMA: Patent without evidence of aneurysm, dissection, vasculitis or significant stenosis. Inflow: Patent without evidence of aneurysm, dissection, vasculitis or significant stenosis. Veins: No obvious venous abnormality within the limitations of this arterial phase study. Review of the MIP images confirms the above findings. NON-VASCULAR Hepatobiliary: No focal liver abnormality is seen. Status post cholecystectomy. No biliary dilatation. Pancreas: Unremarkable. No pancreatic ductal dilatation or surrounding inflammatory changes. Spleen: Normal in size without focal abnormality. Adrenals/Urinary Tract: Adrenal glands are unremarkable. Kidneys are normal, without renal calculi, focal lesion, or hydronephrosis. Bladder is unremarkable. Stomach/Bowel: Stomach is within normal limits. Appendix appears normal. No evidence of bowel wall thickening, distention, or inflammatory changes. Lymphatic: No abdominal or pelvic adenopathy. Reproductive: Mild prostate enlargement with central coarse calcifications. Other: No ascites.  No free air. Musculoskeletal: No acute or significant osseous findings. Review of the MIP images confirms the above findings. IMPRESSION: 1. Negative for acute PE or thoracic aortic dissection. 2. No acute findings. 3. Mild prostate enlargement.  Electronically Signed   By: JONETTA Faes M.D.   On: 06/24/2024 10:23     (Echo, Carotid, EGD, Colonoscopy, ERCP)  Subjective: No complaints   Discharge Exam:     Vitals:    07/04/24 2044 07/05/24 0447  BP: 129/88 (!) 126/93  Pulse: 93 74  Resp: 18 18  Temp: 98.3 F (36.8 C) 98.3 F (36.8 C)  SpO2: 95% 97%          Vitals:    07/04/24 1538 07/04/24 1637 07/04/24 2044 07/05/24 0447  BP: 95/60 (!) 142/87 129/88 (!) 126/93  Pulse: 83 85 93 74  Resp:   18 18 18   Temp: 98.1 F (36.7 C) 98.6 F (37 C) 98.3 F (36.8 C) 98.3 F (36.8 C)  TempSrc: Oral Oral      SpO2: 100% 100% 95% 97%  Weight:          Height:              General: Pt is alert, awake, not in acute distress Cardiovascular: RRR, S1/S2 +, no rubs, no gallops Respiratory: CTA bilaterally, no wheezing, no rhonchi Abdominal: Soft, NT, ND, bowel sounds + Extremities: no edema, no cyanosis       The results of significant diagnostics from this hospitalization (including imaging, microbiology, ancillary and laboratory) are listed below for reference.       Microbiology: No results found for this or any previous visit (from the past 240 hours).    Labs: BNP (last 3 results) Recent Labs (within last 365 days)  No results for input(s): BNP in the last 8760 hours.   Basic Metabolic Panel: Last Labs         Recent Labs  Lab 07/02/24 1802 07/02/24 1829 07/03/24 0509 07/04/24 1032 07/05/24 0533  NA 139 143 137 138 140  K 3.8 3.9 5.0 3.7 3.4*  CL 107 108 106 107 109  CO2 22  --  22 26 25   GLUCOSE 92 95 115* 94 97  BUN 15 17 17 11 6   CREATININE 1.33* 1.30* 1.40* 1.17 1.09  CALCIUM 9.0  --  8.4* 8.4* 8.4*      Liver Function Tests: Last Labs     Recent Labs  Lab 07/02/24 1802  AST 25  ALT 27  ALKPHOS 68  BILITOT 1.0  PROT 7.9  ALBUMIN 4.5      Last Labs     Recent Labs  Lab 07/02/24 1802  LIPASE 39      Last Labs  No results for input(s): AMMONIA in the last 168 hours.    CBC: Last Labs         Recent Labs  Lab 07/02/24 1802 07/02/24 1829 07/03/24 0510 07/04/24 1032 07/05/24 0533  WBC 12.7*  --  11.9* 8.3 7.5  NEUTROABS 9.8*  --   --   --   --   HGB 16.7 16.7 14.6 13.7 13.3  HCT 47.6 49.0 42.3 39.8 37.4*  MCV 88.6  --  89.6 90.9 87.6  PLT 248  --  213 183 173      Cardiac Enzymes: Last Labs  No results for input(s): CKTOTAL, CKMB, CKMBINDEX, TROPONINI in the last 168 hours.   BNP: Last Labs  Invalid input(s): POCBNP   CBG: Last Labs  No results for input(s): GLUCAP in the last 168 hours.   D-Dimer Recent Labs (last 2 labs)  No results for input(s): DDIMER in the last 72 hours.   Hgb A1c Recent Labs (last 2 labs)  No results for input(s): HGBA1C in the last 72 hours.   Lipid Profile Recent Labs (last 2 labs)  No results  for input(s): CHOL, HDL, LDLCALC, TRIG, CHOLHDL, LDLDIRECT in the last 72 hours.   Thyroid function studies  Recent Labs (last 2 labs)  No results for input(s): TSH, T4TOTAL, T3FREE, THYROIDAB in the last 72 hours.   Invalid input(s): FREET3   Anemia work up [employer] (last 2 labs)  No results for input(s): VITAMINB12, FOLATE, FERRITIN, TIBC, IRON, RETICCTPCT in the last 72 hours.   Urinalysis Labs (Brief)          Component Value Date/Time    COLORURINE YELLOW 07/02/2024 1802    APPEARANCEUR HAZY (A) 07/02/2024 1802    LABSPEC 1.028 07/02/2024 1802    PHURINE 5.0 07/02/2024 1802    GLUCOSEU NEGATIVE 07/02/2024 1802    HGBUR LARGE (A) 07/02/2024 1802    BILIRUBINUR NEGATIVE 07/02/2024 1802    BILIRUBINUR negative 07/07/2023 1347    KETONESUR NEGATIVE 07/02/2024 1802    PROTEINUR 30 (A) 07/02/2024 1802    UROBILINOGEN 0.2 07/07/2023 1347    UROBILINOGEN 0.2 04/08/2011 1107    NITRITE NEGATIVE 07/02/2024 1802    LEUKOCYTESUR NEGATIVE 07/02/2024 1802      Sepsis Labs Last Labs        Recent Labs  Lab 07/02/24 1802 07/03/24 0510  07/04/24 1032 07/05/24 0533  WBC 12.7* 11.9* 8.3 7.5      Microbiology No results found for this or any previous visit (from the past 240 hours).     Time coordinating discharge: Over 35 minutes   SIGNED:     Erle Odell Castor, MD         [facility] 07/05/2024, 8:23 AM Pager    If 7PM-7AM, please contact night-coverage [email or web address] Password [email or web address]

## 2024-08-02 ENCOUNTER — Other Ambulatory Visit: Payer: Self-pay | Admitting: Urology

## 2024-08-23 ENCOUNTER — Ambulatory Visit (HOSPITAL_COMMUNITY): Admission: RE | Admit: 2024-08-23 | Source: Home / Self Care | Admitting: Urology

## 2024-08-23 ENCOUNTER — Encounter (HOSPITAL_COMMUNITY): Admission: RE | Payer: Self-pay | Source: Home / Self Care

## 2024-08-23 SURGERY — CYSTOSCOPY/URETEROSCOPY/HOLMIUM LASER/STENT PLACEMENT
Anesthesia: General | Laterality: Left
